# Patient Record
Sex: Female | Born: 1970
Health system: Southern US, Community
[De-identification: ages and names within clinical notes are randomized; demographics above are authoritative.]

## PROBLEM LIST (undated history)

## (undated) DIAGNOSIS — Z9889 Other specified postprocedural states: Secondary | ICD-10-CM

## (undated) DIAGNOSIS — R112 Nausea with vomiting, unspecified: Secondary | ICD-10-CM

## (undated) DIAGNOSIS — K5900 Constipation, unspecified: Secondary | ICD-10-CM

## (undated) DIAGNOSIS — K219 Gastro-esophageal reflux disease without esophagitis: Secondary | ICD-10-CM

## (undated) DIAGNOSIS — M62838 Other muscle spasm: Secondary | ICD-10-CM

## (undated) DIAGNOSIS — J189 Pneumonia, unspecified organism: Secondary | ICD-10-CM

## (undated) DIAGNOSIS — I10 Essential (primary) hypertension: Secondary | ICD-10-CM

## (undated) DIAGNOSIS — Z8709 Personal history of other diseases of the respiratory system: Secondary | ICD-10-CM

## (undated) DIAGNOSIS — R531 Weakness: Secondary | ICD-10-CM

## (undated) DIAGNOSIS — Z87442 Personal history of urinary calculi: Secondary | ICD-10-CM

## (undated) DIAGNOSIS — M2540 Effusion, unspecified joint: Secondary | ICD-10-CM

## (undated) HISTORY — PX: MYRINGOTOMY: SUR874

## (undated) HISTORY — PX: OTHER SURGICAL HISTORY: SHX169

## (undated) HISTORY — PX: CHOLECYSTECTOMY: SHX55

## (undated) HISTORY — DX: Essential (primary) hypertension: I10

---

## 1997-05-11 ENCOUNTER — Ambulatory Visit (HOSPITAL_COMMUNITY): Admission: RE | Admit: 1997-05-11 | Discharge: 1997-05-11 | Payer: Self-pay | Admitting: Obstetrics and Gynecology

## 1997-10-12 ENCOUNTER — Other Ambulatory Visit: Admission: RE | Admit: 1997-10-12 | Discharge: 1997-10-12 | Payer: Self-pay | Admitting: Gynecology

## 1997-10-13 ENCOUNTER — Inpatient Hospital Stay (HOSPITAL_COMMUNITY): Admission: AD | Admit: 1997-10-13 | Discharge: 1997-10-13 | Payer: Self-pay | Admitting: Gynecology

## 1997-10-17 ENCOUNTER — Ambulatory Visit (HOSPITAL_COMMUNITY): Admission: RE | Admit: 1997-10-17 | Discharge: 1997-10-17 | Payer: Self-pay | Admitting: Gynecology

## 1997-10-19 ENCOUNTER — Ambulatory Visit (HOSPITAL_COMMUNITY): Admission: RE | Admit: 1997-10-19 | Discharge: 1997-10-19 | Payer: Self-pay | Admitting: Gynecology

## 1997-10-26 ENCOUNTER — Ambulatory Visit (HOSPITAL_COMMUNITY): Admission: RE | Admit: 1997-10-26 | Discharge: 1997-10-26 | Payer: Self-pay | Admitting: Gynecology

## 1997-11-02 ENCOUNTER — Ambulatory Visit (HOSPITAL_COMMUNITY): Admission: RE | Admit: 1997-11-02 | Discharge: 1997-11-02 | Payer: Self-pay | Admitting: Gynecology

## 1998-05-31 ENCOUNTER — Other Ambulatory Visit: Admission: RE | Admit: 1998-05-31 | Discharge: 1998-05-31 | Payer: Self-pay | Admitting: Gynecology

## 1999-10-04 ENCOUNTER — Encounter: Payer: Self-pay | Admitting: Family Medicine

## 1999-10-04 ENCOUNTER — Ambulatory Visit (HOSPITAL_COMMUNITY): Admission: RE | Admit: 1999-10-04 | Discharge: 1999-10-04 | Payer: Self-pay | Admitting: Family Medicine

## 1999-11-13 ENCOUNTER — Other Ambulatory Visit: Admission: RE | Admit: 1999-11-13 | Discharge: 1999-11-13 | Payer: Self-pay | Admitting: Family Medicine

## 1999-12-14 ENCOUNTER — Other Ambulatory Visit: Admission: RE | Admit: 1999-12-14 | Discharge: 1999-12-14 | Payer: Self-pay | Admitting: Family Medicine

## 1999-12-14 ENCOUNTER — Other Ambulatory Visit: Admission: RE | Admit: 1999-12-14 | Discharge: 1999-12-14 | Payer: Self-pay | Admitting: Unknown Physician Specialty

## 1999-12-25 ENCOUNTER — Encounter: Payer: Self-pay | Admitting: Family Medicine

## 1999-12-25 ENCOUNTER — Ambulatory Visit (HOSPITAL_COMMUNITY): Admission: RE | Admit: 1999-12-25 | Discharge: 1999-12-25 | Payer: Self-pay | Admitting: *Deleted

## 2000-02-13 ENCOUNTER — Encounter: Payer: Self-pay | Admitting: Obstetrics and Gynecology

## 2000-02-13 ENCOUNTER — Ambulatory Visit (HOSPITAL_COMMUNITY): Admission: RE | Admit: 2000-02-13 | Discharge: 2000-02-13 | Payer: Self-pay | Admitting: Obstetrics and Gynecology

## 2000-05-19 ENCOUNTER — Inpatient Hospital Stay (HOSPITAL_COMMUNITY): Admission: AD | Admit: 2000-05-19 | Discharge: 2000-05-19 | Payer: Self-pay | Admitting: Family Medicine

## 2000-05-19 ENCOUNTER — Ambulatory Visit (HOSPITAL_COMMUNITY): Admission: RE | Admit: 2000-05-19 | Discharge: 2000-05-19 | Payer: Self-pay | Admitting: Obstetrics and Gynecology

## 2000-05-19 ENCOUNTER — Encounter: Payer: Self-pay | Admitting: Obstetrics and Gynecology

## 2000-05-22 ENCOUNTER — Inpatient Hospital Stay (HOSPITAL_COMMUNITY): Admission: AD | Admit: 2000-05-22 | Discharge: 2000-05-26 | Payer: Self-pay | Admitting: Obstetrics and Gynecology

## 2004-05-10 ENCOUNTER — Other Ambulatory Visit: Admission: RE | Admit: 2004-05-10 | Discharge: 2004-05-10 | Payer: Self-pay | Admitting: Gynecology

## 2010-12-14 ENCOUNTER — Emergency Department (HOSPITAL_COMMUNITY)
Admission: EM | Admit: 2010-12-14 | Discharge: 2010-12-14 | Disposition: A | Discharge status: Home / Self Care | Payer: PRIVATE HEALTH INSURANCE | Attending: Emergency Medicine | Admitting: Emergency Medicine

## 2010-12-14 DIAGNOSIS — J189 Pneumonia, unspecified organism: Secondary | ICD-10-CM | POA: Insufficient documentation

## 2010-12-14 DIAGNOSIS — H5789 Other specified disorders of eye and adnexa: Secondary | ICD-10-CM | POA: Insufficient documentation

## 2010-12-14 DIAGNOSIS — Z79899 Other long term (current) drug therapy: Secondary | ICD-10-CM | POA: Insufficient documentation

## 2010-12-14 DIAGNOSIS — T394X5A Adverse effect of antirheumatics, not elsewhere classified, initial encounter: Secondary | ICD-10-CM | POA: Insufficient documentation

## 2010-12-14 DIAGNOSIS — R51 Headache: Secondary | ICD-10-CM | POA: Insufficient documentation

## 2010-12-14 DIAGNOSIS — I10 Essential (primary) hypertension: Secondary | ICD-10-CM | POA: Insufficient documentation

## 2010-12-22 ENCOUNTER — Other Ambulatory Visit: Payer: Self-pay | Admitting: Family Medicine

## 2010-12-22 ENCOUNTER — Ambulatory Visit (HOSPITAL_COMMUNITY)
Admission: RE | Admit: 2010-12-22 | Discharge: 2010-12-22 | Disposition: A | Discharge status: Home / Self Care | Payer: PRIVATE HEALTH INSURANCE | Source: Ambulatory Visit | Attending: Family Medicine | Admitting: Family Medicine

## 2010-12-22 DIAGNOSIS — J189 Pneumonia, unspecified organism: Secondary | ICD-10-CM | POA: Insufficient documentation

## 2010-12-22 MED ORDER — IOHEXOL 300 MG/ML  SOLN: 125.0000 mL | Freq: Once | INTRAMUSCULAR | Status: AC | PRN

## 2012-08-03 ENCOUNTER — Telehealth: Payer: Self-pay

## 2012-08-03 NOTE — Telephone Encounter (Signed)
Patient calling to get Lisinopril refilled -has not been here in two years says Dr. Elbert Ewings gave her the Rx.  - lauenstein Please let her know if this can be refilled or if she needs to come in to be seen   Best:  [phone]

## 2012-08-03 NOTE — Telephone Encounter (Signed)
No we can not do this without office visit that is very poor patient care. I have called her to advise.

## 2013-07-12 ENCOUNTER — Ambulatory Visit (INDEPENDENT_AMBULATORY_CARE_PROVIDER_SITE_OTHER): Payer: BC Managed Care – PPO | Admitting: Internal Medicine

## 2013-07-12 ENCOUNTER — Ambulatory Visit: Payer: BC Managed Care – PPO

## 2013-07-12 VITALS — BP 128/72 | HR 88 | Temp 98.2°F | Resp 16 | Ht 66.0 in | Wt 326.0 lb

## 2013-07-12 DIAGNOSIS — I10 Essential (primary) hypertension: Secondary | ICD-10-CM

## 2013-07-12 DIAGNOSIS — M545 Low back pain, unspecified: Secondary | ICD-10-CM

## 2013-07-12 DIAGNOSIS — D649 Anemia, unspecified: Secondary | ICD-10-CM

## 2013-07-12 DIAGNOSIS — Z5181 Encounter for therapeutic drug level monitoring: Secondary | ICD-10-CM

## 2013-07-12 LAB — TSH: TSH: 1.354 u[IU]/mL (ref 0.350–4.500)

## 2013-07-12 LAB — POCT CBC
Granulocyte percent: 75.3 %G (ref 37–80)
HCT, POC: 40.9 % (ref 37.7–47.9)
Hemoglobin: 13.1 g/dL (ref 12.2–16.2)
Lymph, poc: 1.9 (ref 0.6–3.4)
MCH, POC: 28.7 pg (ref 27–31.2)
MCHC: 32 g/dL (ref 31.8–35.4)
MCV: 89.4 fL (ref 80–97)
MID (cbc): 0.4 (ref 0–0.9)
MPV: 10 fL (ref 0–99.8)
POC Granulocyte: 7 — AB (ref 2–6.9)
POC LYMPH PERCENT: 20.9 %L (ref 10–50)
POC MID %: 3.8 %M (ref 0–12)
Platelet Count, POC: 307 10*3/uL (ref 142–424)
RBC: 4.57 M/uL (ref 4.04–5.48)
RDW, POC: 14.9 %
WBC: 9.3 10*3/uL (ref 4.6–10.2)

## 2013-07-12 LAB — POCT URINE PREGNANCY: Preg Test, Ur: NEGATIVE

## 2013-07-12 LAB — COMPREHENSIVE METABOLIC PANEL
ALBUMIN: 4 g/dL (ref 3.5–5.2)
ALT: 18 U/L (ref 0–35)
AST: 15 U/L (ref 0–37)
Alkaline Phosphatase: 101 U/L (ref 39–117)
BUN: 13 mg/dL (ref 6–23)
CHLORIDE: 102 meq/L (ref 96–112)
CO2: 27 mEq/L (ref 19–32)
Calcium: 9.3 mg/dL (ref 8.4–10.5)
Creat: 0.76 mg/dL (ref 0.50–1.10)
Glucose, Bld: 85 mg/dL (ref 70–99)
Potassium: 3.7 mEq/L (ref 3.5–5.3)
SODIUM: 141 meq/L (ref 135–145)
TOTAL PROTEIN: 6.7 g/dL (ref 6.0–8.3)
Total Bilirubin: 0.4 mg/dL (ref 0.2–1.2)

## 2013-07-12 LAB — POCT URINALYSIS DIPSTICK
Bilirubin, UA: NEGATIVE
GLUCOSE UA: NEGATIVE
LEUKOCYTES UA: NEGATIVE
Nitrite, UA: NEGATIVE
PH UA: 7.5
Protein, UA: 30
Spec Grav, UA: 1.025
Urobilinogen, UA: 0.2

## 2013-07-12 LAB — POCT UA - MICROSCOPIC ONLY
Casts, Ur, LPF, POC: NEGATIVE
Crystals, Ur, HPF, POC: NEGATIVE
MUCUS UA: NEGATIVE
Yeast, UA: NEGATIVE

## 2013-07-12 LAB — POCT SEDIMENTATION RATE: POCT SED RATE: 40 mm/hr — AB (ref 0–22)

## 2013-07-12 MED ORDER — HYDROCODONE-ACETAMINOPHEN 5-325 MG PO TABS: 1.0000 | ORAL_TABLET | Freq: Four times a day (QID) | ORAL | Status: DC | PRN

## 2013-07-12 MED ORDER — METHOCARBAMOL 750 MG PO TABS: 750.0000 mg | ORAL_TABLET | Freq: Four times a day (QID) | ORAL | Status: DC

## 2013-07-12 NOTE — Progress Notes (Signed)
Subjective:    Patient ID: Tammy Briggs, female    DOB: [birth year], 43 y.o.   MRN: [id number]  HPI 43 yr old female is here today with low back pain for 4 weeks. She states she has not fallen or injured herself. She denies numbness, tingling but more painful to sit. She states she has tried a pressure point brace, exercises, ibuprofen with no relief. She states she has had kidney stones in the past. She states it is a deep pain and not painful to touch. She denies fever, hematuria, nausea, abdominal pain. No freq, urgency, hematuria, dysuria. Husband had vasectomy, LMP 4/25 and normal. Inspection of back, buttocks, sides resulted in no visible rashes, redness, pain. Has htn txed by Dr. Merla Richesoolittle, on lisinopril.  She is taking very big doses of ibuprofen for weeks, no hx for side affects   Review of Systems Has not seen gyn in years    Objective:   Physical Exam  Vitals reviewed. Constitutional: She is oriented to person, place, and time. She appears well-nourished. She appears distressed.  HENT:  Head: Normocephalic.  Eyes: EOM are normal. No scleral icterus.  Neck: Normal range of motion.  Cardiovascular: Normal rate and normal heart sounds.   Pulmonary/Chest: Effort normal.  Abdominal: Soft. There is no tenderness.  Musculoskeletal: She exhibits tenderness.       Lumbar back: She exhibits decreased range of motion, tenderness, pain and spasm. She exhibits no bony tenderness, no swelling, no edema, no deformity and normal pulse.       Back:  Straight leg left positive pain into buttock  Neurological: She is alert and oriented to person, place, and time. She has normal strength. No sensory deficit. She exhibits normal muscle tone. Coordination and gait normal.  Reflex Scores:      Patellar reflexes are 0 on the right side and 0 on the left side.      Achilles reflexes are 0 on the right side and 0 on the left side. Will not relax to test dtrs  Skin: Skin is intact. No rash  noted.  Psychiatric: She has a normal mood and affect. Her behavior is normal.   Very obese/ 128/78  [facility] reading (PRIMARY) by  Dr Perrin MalteseGuest normal spine Results for orders placed in visit on 07/12/13  POCT CBC      Result Value Ref Range   WBC 9.3  4.6 - 10.2 K/uL   Lymph, poc 1.9  0.6 - 3.4   POC LYMPH PERCENT 20.9  10 - 50 %L   MID (cbc) 0.4  0 - 0.9   POC MID % 3.8  0 - 12 %M   POC Granulocyte 7.0 (*) 2 - 6.9   Granulocyte percent 75.3  37 - 80 %G   RBC 4.57  4.04 - 5.48 M/uL   Hemoglobin 13.1  12.2 - 16.2 g/dL   HCT, POC 46.940.9  62.937.7 - 47.9 %   MCV 89.4  80 - 97 fL   MCH, POC 28.7  27 - 31.2 pg   MCHC 32.0  31.8 - 35.4 g/dL   RDW, POC 52.814.9     Platelet Count, POC 307  142 - 424 K/uL   MPV 10.0  0 - 99.8 fL  POCT UA - MICROSCOPIC ONLY      Result Value Ref Range   WBC, Ur, HPF, POC 1-4     RBC, urine, microscopic 0-2     Bacteria, U Microscopic trace  Mucus, UA neg     Epithelial cells, urine per micros 0-6     Crystals, Ur, HPF, POC neg     Casts, Ur, LPF, POC neg     Yeast, UA neg    POCT URINALYSIS DIPSTICK      Result Value Ref Range   Color, UA yellow     Clarity, UA clear     Glucose, UA neg     Bilirubin, UA neg     Ketones, UA trace     Spec Grav, UA 1.025     Blood, UA trace     pH, UA 7.5     Protein, UA 30     Urobilinogen, UA 0.2     Nitrite, UA neg     Leukocytes, UA Negative    POCT URINE PREGNANCY      Result Value Ref Range   Preg Test, Ur Negative           Assessment & Plan:  LBP one month/Possible bulging disc left Schedule full gyn exam HTN controlled/cmet done Nsaid excess use/DC NSAIDS for now. Tylenol/Vicodin

## 2013-07-12 NOTE — Patient Instructions (Signed)
Back Pain, Adult Low back pain is very common. About 1 in 5 people have back pain.The cause of low back pain is rarely dangerous. The pain often gets better over time.About half of people with a sudden onset of back pain feel better in just 2 weeks. About 8 in 10 people feel better by 6 weeks.  CAUSES Some common causes of back pain include:  Strain of the muscles or ligaments supporting the spine.  Wear and tear (degeneration) of the spinal discs.  Arthritis.  Direct injury to the back. DIAGNOSIS Most of the time, the direct cause of low back pain is not known.However, back pain can be treated effectively even when the exact cause of the pain is unknown.Answering your caregiver's questions about your overall health and symptoms is one of the most accurate ways to make sure the cause of your pain is not dangerous. If your caregiver needs more information, he or she may order lab work or imaging tests (X-rays or MRIs).However, even if imaging tests show changes in your back, this usually does not require surgery. HOME CARE INSTRUCTIONS For many people, back pain returns.Since low back pain is rarely dangerous, it is often a condition that people can learn to manageon their own.   Remain active. It is stressful on the back to sit or stand in one place. Do not sit, drive, or stand in one place for more than 30 minutes at a time. Take short walks on level surfaces as soon as pain allows.Try to increase the length of time you walk each day.  Do not stay in bed.Resting more than 1 or 2 days can delay your recovery.  Do not avoid exercise or work.Your body is made to move.It is not dangerous to be active, even though your back may hurt.Your back will likely heal faster if you return to being active before your pain is gone.  Pay attention to your body when you bend and lift. Many people have less discomfortwhen lifting if they bend their knees, keep the load close to their bodies,and  avoid twisting. Often, the most comfortable positions are those that put less stress on your recovering back.  Find a comfortable position to sleep. Use a firm mattress and lie on your side with your knees slightly bent. If you lie on your back, put a pillow under your knees.  Only take over-the-counter or prescription medicines as directed by your caregiver. Over-the-counter medicines to reduce pain and inflammation are often the most helpful.Your caregiver may prescribe muscle relaxant drugs.These medicines help dull your pain so you can more quickly return to your normal activities and healthy exercise.  Put ice on the injured area.  Put ice in a plastic bag.  Place a towel between your skin and the bag.  Leave the ice on for 15-20 minutes, 03-04 times a day for the first 2 to 3 days. After that, ice and heat may be alternated to reduce pain and spasms.  Ask your caregiver about trying back exercises and gentle massage. This may be of some benefit.  Avoid feeling anxious or stressed.Stress increases muscle tension and can worsen back pain.It is important to recognize when you are anxious or stressed and learn ways to manage it.Exercise is a great option. SEEK MEDICAL CARE IF:  You have pain that is not relieved with rest or medicine.  You have pain that does not improve in 1 week.  You have new symptoms.  You are generally not feeling well. SEEK   IMMEDIATE MEDICAL CARE IF:   You have pain that radiates from your back into your legs.  You develop new bowel or bladder control problems.  You have unusual weakness or numbness in your arms or legs.  You develop nausea or vomiting.  You develop abdominal pain.  You feel faint. Document Released: 02/11/2005 Document Revised: 08/13/2011 Document Reviewed: 07/02/2010 ExitCare Patient Information 2014 [employer].  

## 2013-07-13 ENCOUNTER — Telehealth: Payer: Self-pay

## 2013-07-13 ENCOUNTER — Encounter: Payer: Self-pay | Admitting: *Deleted

## 2013-07-13 DIAGNOSIS — M62838 Other muscle spasm: Secondary | ICD-10-CM

## 2013-07-13 MED ORDER — METAXALONE 800 MG PO TABS: 800.0000 mg | ORAL_TABLET | Freq: Three times a day (TID) | ORAL | Status: DC

## 2013-07-13 NOTE — Telephone Encounter (Signed)
Pt.notified

## 2013-07-13 NOTE — Telephone Encounter (Signed)
Medication sent in. Have patient stop the Robaxin.

## 2013-07-13 NOTE — Telephone Encounter (Signed)
Robaxin has been giving pt severe headaches. Can we prescribe something different? [facility]

## 2013-07-13 NOTE — Telephone Encounter (Signed)
PT WAS SEEN YESTERDAY AND WOULD LIKE TO SPEAK WITH SOMEONE REGARDING HER MEDICATION. PLEASE CALL [phone]

## 2013-08-21 ENCOUNTER — Other Ambulatory Visit: Payer: Self-pay | Admitting: Internal Medicine

## 2013-08-26 ENCOUNTER — Ambulatory Visit (INDEPENDENT_AMBULATORY_CARE_PROVIDER_SITE_OTHER): Payer: BC Managed Care – PPO | Admitting: Emergency Medicine

## 2013-08-26 VITALS — BP 192/112 | HR 85 | Temp 97.7°F | Resp 18 | Ht 66.0 in | Wt 326.0 lb

## 2013-08-26 DIAGNOSIS — M5432 Sciatica, left side: Secondary | ICD-10-CM

## 2013-08-26 DIAGNOSIS — I10 Essential (primary) hypertension: Secondary | ICD-10-CM

## 2013-08-26 DIAGNOSIS — M5430 Sciatica, unspecified side: Secondary | ICD-10-CM

## 2013-08-26 MED ORDER — NAPROXEN SODIUM 550 MG PO TABS: 550.0000 mg | ORAL_TABLET | Freq: Two times a day (BID) | ORAL | Status: DC

## 2013-08-26 MED ORDER — HYDROCODONE-ACETAMINOPHEN 5-325 MG PO TABS: 1.0000 | ORAL_TABLET | ORAL | Status: DC | PRN

## 2013-08-26 MED ORDER — LISINOPRIL-HYDROCHLOROTHIAZIDE 10-12.5 MG PO TABS: 1.0000 | ORAL_TABLET | Freq: Every day | ORAL | Status: DC

## 2013-08-26 MED ORDER — CYCLOBENZAPRINE HCL 10 MG PO TABS: 10.0000 mg | ORAL_TABLET | Freq: Three times a day (TID) | ORAL | Status: DC | PRN

## 2013-08-26 NOTE — Progress Notes (Signed)
[facility] [address], [place] [address] [phone]  Date:  08/26/2013   Name:  Tammy PeltChristy S Briggs   DOB:  October 20, 1970   MRN:  [id number]  PCP:  Tonye PearsonOLITTLE, ROBERT P, MD    Chief Complaint: Back Pain   History of Present Illness:  Tammy Briggs is a 43 y.o. very pleasant female patient who presents with the following:  Seen in May for pain radiating down her left leg associated with numbness and tingling at times.  No weakness.  No history of injury or overuse.  Lumbar spine film done in May was negative.  Had relief of pain but now has recurred.  Says worse in am on arising and diminishes when she gets on her hands and knees.  No improvement with over the counter medications or other home remedies. Denies other complaint or health concern today.   Has not been compliant with blood pressure medication  There are no active problems to display for this patient.   Past Medical History  Diagnosis Date  . Hypertension     Past Surgical History  Procedure Laterality Date  . Cholecystectomy      History  Substance Use Topics  . Smoking status: Former Games developermoker  . Smokeless tobacco: Not on file  . Alcohol Use: No    Family History  Problem Relation Age of Onset  . Hypertension Mother   . Hypertension Father   . Diabetes Father     No Known Allergies  Medication list has been reviewed and updated.  Current Outpatient Prescriptions on File Prior to Visit  Medication Sig Dispense Refill  . lisinopril-hydrochlorothiazide (PRINZIDE,ZESTORETIC) 10-12.5 MG per tablet Take 1 tablet by mouth daily.      . metaxalone (SKELAXIN) 800 MG tablet Take 1 tablet (800 mg total) by mouth 3 (three) times daily.  30 tablet  0   No current facility-administered medications on file prior to visit.    Review of Systems:  As per HPI, otherwise negative.    Physical Examination: Filed Vitals:   08/26/13 1644  BP: 192/112  Pulse: 85  Temp: 97.7 F (36.5  C)  Resp: 18   Filed Vitals:   08/26/13 1644  Height: 5\' 6"  (1.676 m)  Weight: 326 lb (147.873 kg)   Body mass index is 52.64 kg/(m^2). Ideal Body Weight: Weight in (lb) to have BMI = 25: 154.6   GEN: morbidly obese, NAD, Non-toxic, Alert & Oriented x 3 HEENT: Atraumatic, Normocephalic.  Ears and Nose: No external deformity. EXTR: No clubbing/cyanosis/edema NEURO: Normal gait.  Normal motor and sensory lowers.  DTR's intact PSYCH: Normally interactive. Conversant. Not depressed or anxious appearing.  Calm demeanor.    Assessment and Plan: Sciatic neuritis Morbid obesity. Hypertension MRI Offered referral to bariatric Anaprox Flexeril vicodin   Signed,  Phillips OdorJeffery Avilla, MD

## 2013-08-27 ENCOUNTER — Telehealth: Payer: Self-pay

## 2013-08-27 NOTE — Telephone Encounter (Signed)
To expedite the review of your request, contact AIM at [phone] to find out what additional information is needed or if the ordering provider would like to discuss this case with a physician reviewer.         Order Request Summary     Request Status:  In Progress                     Health Plan: [employer]               Case Due to Close On/Before:  09/01/2013         Scheduled Date of Service:  08/27/2013                    Member Information: Sida,Florenda Member #:[id number] [address] [id number] Date of Birth:1970-04-23 Phone:   Ordering Provider:  Shane CrutchNDERSON,JEFFREY S [address] [id number] Phone:[phone] Fax:[phone] [id number]   Imaging Facility:      [facility] [address]  [place] Phone:[phone] Fax: [id number]                      Exam Information:               The information below was obtained from the Ordering Provider and has not been independently verified by AIM. AIM assumes no responsibility for the accuracy of this information or for its consistency with the patient's medical record.         SUMMARY OF EXAMS (1)       Exam   Exam Request Status         Lumbar Spine - MRI   Without Contrast          Review ExamWithdraw Exam                                                                               = Multiple Decisions Rendered                     -------------------------------------------------------------------------------- The issuance of an Order ID is not a guarantee of benefits; payment is subject to the member's eligibility and plan provisions in effect at the time of  service. --------------------------------------------------------------------------------                                                                             [employer] and [employer] [place] is an Armed forces technical officerindependent licensee of the [employer] and [employer].                   Have a comment or suggestion?     Copyright  2012, [facility], All Rights Reserved.                              --------------------------------------------------------------------------------

## 2013-08-27 NOTE — Telephone Encounter (Signed)
Not open today.  Try again on Monday 08/30/2013.

## 2013-08-31 ENCOUNTER — Telehealth: Payer: Self-pay

## 2013-08-31 NOTE — Telephone Encounter (Signed)
Tried to complete peer to peer, sat on hold for 10 minutes without reaching anyone.  Will try again later.

## 2013-08-31 NOTE — Telephone Encounter (Signed)
Tammy Briggs - Pt was seen on 08-26-13 and thinks that one of the medicines prescribed is upsetting her stomach. [phone]

## 2013-08-31 NOTE — Telephone Encounter (Signed)
Pt says that the Anaprox is really upsetting her stomach, even when taking it with food. She is still having the sciatica pain. Wants to know if there is a different anti inflammatory that we can try?

## 2013-08-31 NOTE — Telephone Encounter (Signed)
Approval #: [id number] 30 days to complete

## 2013-08-31 NOTE — Telephone Encounter (Signed)
Anaprox, Flexeril, and Vicodin were given on 08/26/13. Left message on machine to call back to find out which one is bothering her.

## 2013-09-01 MED ORDER — MELOXICAM 7.5 MG PO TABS: 7.5000 mg | ORAL_TABLET | Freq: Every day | ORAL | Status: DC

## 2013-09-01 NOTE — Telephone Encounter (Signed)
Advised pt

## 2013-09-01 NOTE — Telephone Encounter (Signed)
Sent to [facility] with the authorization number

## 2013-09-01 NOTE — Telephone Encounter (Signed)
I sent in Mobic for the patient.

## 2013-09-02 ENCOUNTER — Telehealth: Payer: Self-pay

## 2013-09-02 NOTE — Telephone Encounter (Signed)
Pt called in and wanted to know if she could possibly have something stronger than Hydrocodone, She is having to take 3-4 of them daily. She is having her MRI tomorrow then going out of town. She would like a refill or something stronger by her leaving this weekend. She can be reached at [phone]. Thank you

## 2013-09-03 ENCOUNTER — Other Ambulatory Visit: Payer: Self-pay | Admitting: Emergency Medicine

## 2013-09-03 ENCOUNTER — Ambulatory Visit
Admission: RE | Admit: 2013-09-03 | Discharge: 2013-09-03 | Disposition: A | Discharge status: Home / Self Care | Payer: BC Managed Care – PPO | Source: Ambulatory Visit | Attending: Emergency Medicine | Admitting: Emergency Medicine

## 2013-09-03 DIAGNOSIS — M5432 Sciatica, left side: Secondary | ICD-10-CM

## 2013-09-03 MED ORDER — HYDROCODONE-ACETAMINOPHEN 5-325 MG PO TABS: 1.0000 | ORAL_TABLET | ORAL | Status: DC | PRN

## 2013-09-03 NOTE — Telephone Encounter (Signed)
Pt typically takes one tab at 8am, sometimes another at 9 if the 8am one didn't help, and then she takes another tab at 4pm, and another qhs prn. Can we RF? If so please print, she's ok with coming to get it.

## 2013-09-03 NOTE — Telephone Encounter (Signed)
Needs referral to neurosurgery

## 2013-09-03 NOTE — Telephone Encounter (Signed)
Can you please let me know what to tell the pt as to why we are referring her to neuro. Thanks

## 2013-09-03 NOTE — Telephone Encounter (Signed)
Pt notified.  Dr. Dareen PianoAnderson, can you review her MRI please.

## 2013-09-03 NOTE — Telephone Encounter (Signed)
She has a herniated disc

## 2013-09-03 NOTE — Telephone Encounter (Signed)
vicodin is a q4h drug.  If she wants a refill, she must come by and pick it up

## 2013-09-03 NOTE — Telephone Encounter (Signed)
Pt.notified

## 2013-09-15 ENCOUNTER — Other Ambulatory Visit: Payer: PRIVATE HEALTH INSURANCE

## 2013-09-21 ENCOUNTER — Encounter (HOSPITAL_COMMUNITY): Payer: Self-pay | Admitting: Pharmacy Technician

## 2013-09-24 ENCOUNTER — Other Ambulatory Visit: Payer: Self-pay | Admitting: Neurosurgery

## 2013-09-28 ENCOUNTER — Encounter (HOSPITAL_COMMUNITY)
Admission: RE | Admit: 2013-09-28 | Discharge: 2013-09-28 | Disposition: A | Discharge status: Home / Self Care | Payer: BC Managed Care – PPO | Source: Ambulatory Visit | Attending: Neurosurgery | Admitting: Neurosurgery

## 2013-09-28 ENCOUNTER — Ambulatory Visit (HOSPITAL_COMMUNITY)
Admission: RE | Admit: 2013-09-28 | Discharge: 2013-09-28 | Disposition: A | Discharge status: Home / Self Care | Payer: BC Managed Care – PPO | Source: Ambulatory Visit | Attending: Anesthesiology | Admitting: Anesthesiology

## 2013-09-28 ENCOUNTER — Encounter (HOSPITAL_COMMUNITY): Payer: Self-pay

## 2013-09-28 DIAGNOSIS — Z01818 Encounter for other preprocedural examination: Secondary | ICD-10-CM | POA: Insufficient documentation

## 2013-09-28 HISTORY — DX: Nausea with vomiting, unspecified: R11.2

## 2013-09-28 HISTORY — DX: Effusion, unspecified joint: M25.40

## 2013-09-28 HISTORY — DX: Personal history of urinary calculi: Z87.442

## 2013-09-28 HISTORY — DX: Gastro-esophageal reflux disease without esophagitis: K21.9

## 2013-09-28 HISTORY — DX: Pneumonia, unspecified organism: J18.9

## 2013-09-28 HISTORY — DX: Weakness: R53.1

## 2013-09-28 HISTORY — DX: Personal history of other diseases of the respiratory system: Z87.09

## 2013-09-28 HISTORY — DX: Other specified postprocedural states: Z98.890

## 2013-09-28 HISTORY — DX: Other muscle spasm: M62.838

## 2013-09-28 HISTORY — DX: Constipation, unspecified: K59.00

## 2013-09-28 LAB — SURGICAL PCR SCREEN
MRSA, PCR: NEGATIVE
Staphylococcus aureus: POSITIVE — AB

## 2013-09-28 LAB — BASIC METABOLIC PANEL
ANION GAP: 14 (ref 5–15)
BUN: 15 mg/dL (ref 6–23)
CHLORIDE: 102 meq/L (ref 96–112)
CO2: 27 meq/L (ref 19–32)
CREATININE: 0.68 mg/dL (ref 0.50–1.10)
Calcium: 9.3 mg/dL (ref 8.4–10.5)
GFR calc non Af Amer: >90 mL/min (ref >90)
Glucose, Bld: 83 mg/dL (ref 70–99)
Potassium: 3.8 mEq/L (ref 3.7–5.3)
Sodium: 143 mEq/L (ref 137–147)

## 2013-09-28 LAB — CBC
HCT: 39.5 % (ref 36.0–46.0)
HEMOGLOBIN: 12.9 g/dL (ref 12.0–15.0)
MCH: 28.4 pg (ref 26.0–34.0)
MCHC: 32.7 g/dL (ref 30.0–36.0)
MCV: 86.8 fL (ref 78.0–100.0)
Platelets: 268 10*3/uL (ref 150–400)
RBC: 4.55 MIL/uL (ref 3.87–5.11)
RDW: 14 % (ref 11.5–15.5)
WBC: 6.1 10*3/uL (ref 4.0–10.5)

## 2013-09-28 LAB — HCG, SERUM, QUALITATIVE: PREG SERUM: NEGATIVE

## 2013-09-28 NOTE — Pre-Procedure Instructions (Signed)
Pete PeltChristy S Garinger  09/28/2013   Your procedure is scheduled on:  Tues, Aug 11 @ 10:00 AM  [facility]  at 8:00 AM.  Call this number if you have problems the morning of surgery: [phone]   Remember:   Do not eat food or drink liquids after midnight.   Take these medicines the morning of surgery with A SIP OF WATER: Pain Pill(if needed)              No Goody's,BC's,Aleve,Aspirin,Ibuprofen,Fish Oil,or any Herbal Medications   Do not wear jewelry, make-up or nail polish.  Do not wear lotions, powders, or perfumes. You may wear deodorant.  Do not shave 48 hours prior to surgery.   Do not bring valuables to the hospital.  [facility] is not responsible                  for any belongings or valuables.               Contacts, dentures or bridgework may not be worn into surgery.  Leave suitcase in the car. After surgery it may be brought to your room.  For patients admitted to the hospital, discharge time is determined by your                treatment team.               Patients discharged the day of surgery will not be allowed to drive  home.    Special Instructions:  [place] - Preparing for Surgery  Before surgery, you can play an important role.  Because skin is not sterile, your skin needs to be as free of germs as possible.  You can reduce the number of germs on you skin by washing with CHG (chlorahexidine gluconate) soap before surgery.  CHG is an antiseptic cleaner which kills germs and bonds with the skin to continue killing germs even after washing.  Please DO NOT use if you have an allergy to CHG or antibacterial soaps.  If your skin becomes reddened/irritated stop using the CHG and inform your nurse when you arrive at Short Stay.  Do not shave (including legs and underarms) for at least 48 hours prior to the first CHG shower.  You may shave your face.  Please follow these instructions carefully:   1.  Shower with CHG Soap the night before surgery  and the                                morning of Surgery.  2.  If you choose to wash your hair, wash your hair first as usual with your       normal shampoo.  3.  After you shampoo, rinse your hair and body thoroughly to remove the                      Shampoo.  4.  Use CHG as you would any other liquid soap.  You can apply chg directly       to the skin and wash gently with scrungie or a clean washcloth.  5.  Apply the CHG Soap to your body ONLY FROM THE NECK DOWN.        Do not use on open wounds or open sores.  Avoid contact with your eyes,       ears, mouth and genitals (private parts).  Wash genitals (private parts)       with your normal soap.  6.  Wash thoroughly, paying special attention to the area where your surgery        will be performed.  7.  Thoroughly rinse your body with warm water from the neck down.  8.  DO NOT shower/wash with your normal soap after using and rinsing off       the CHG Soap.  9.  Pat yourself dry with a clean towel.            10.  Wear clean pajamas.            11.  Place clean sheets on your bed the night of your first shower and do not        sleep with pets.  Day of Surgery  Do not apply any lotions/deoderants the morning of surgery.  Please wear clean clothes to the hospital/surgery center.     Please read over the following fact sheets that you were given: Pain Booklet, Coughing and Deep Breathing, MRSA Information and Surgical Site Infection Prevention

## 2013-09-28 NOTE — Progress Notes (Signed)
Pt doesn't have a cardiologist  Denies ever having an echo/stress test/heart cath  Denies EKG or CXR in past yr  Medical Md is Dr.Doolittle

## 2013-09-29 NOTE — Progress Notes (Signed)
Anesthesia Chart Review:  Patient is a 43 year old female posted for one level lumbar laminectomy/decompression microdiscectomy on 10/05/13 by Dr. Gerlene FeeKritzer.  History includes former smoker, post-operative N/V, GERD, HTN, nephrolithiasis, cholecystectomy, adenoidectomy, PNA one year ago. BMI is consistent with morbid obesity.  PCP is Dr. Merla Richesoolittle.  EKG on 09/28/13 showed: NSR, cannot rule out anterior infarct (age undetermined). Currently there are no comparison EKGs available.  No CV symptoms documented at her PAT visit.   Preoperative CXR and labs noted.   She will be further evaluated by her assigned anesthesiologist on the day of surgery.  If no acute changes or new CV symptoms then I would anticipate that she could proceed as planned.  Velna Ochsllison Masen Luallen, PA-C [facility]/Anesthesiology Phone [phone] 09/29/2013 10:33 AM

## 2013-10-03 ENCOUNTER — Emergency Department (HOSPITAL_COMMUNITY)
Admission: EM | Admit: 2013-10-03 | Discharge: 2013-10-03 | Disposition: A | Discharge status: Home / Self Care | Payer: BC Managed Care – PPO | Attending: Emergency Medicine | Admitting: Emergency Medicine

## 2013-10-03 ENCOUNTER — Encounter (HOSPITAL_COMMUNITY): Payer: Self-pay | Admitting: Emergency Medicine

## 2013-10-03 DIAGNOSIS — M5430 Sciatica, unspecified side: Secondary | ICD-10-CM | POA: Diagnosis not present

## 2013-10-03 DIAGNOSIS — Z87442 Personal history of urinary calculi: Secondary | ICD-10-CM | POA: Insufficient documentation

## 2013-10-03 DIAGNOSIS — IMO0002 Reserved for concepts with insufficient information to code with codable children: Secondary | ICD-10-CM | POA: Insufficient documentation

## 2013-10-03 DIAGNOSIS — M5442 Lumbago with sciatica, left side: Secondary | ICD-10-CM

## 2013-10-03 DIAGNOSIS — X500XXA Overexertion from strenuous movement or load, initial encounter: Secondary | ICD-10-CM | POA: Diagnosis not present

## 2013-10-03 DIAGNOSIS — I10 Essential (primary) hypertension: Secondary | ICD-10-CM | POA: Insufficient documentation

## 2013-10-03 DIAGNOSIS — Z8701 Personal history of pneumonia (recurrent): Secondary | ICD-10-CM | POA: Insufficient documentation

## 2013-10-03 DIAGNOSIS — Y9289 Other specified places as the place of occurrence of the external cause: Secondary | ICD-10-CM | POA: Diagnosis not present

## 2013-10-03 DIAGNOSIS — Z79899 Other long term (current) drug therapy: Secondary | ICD-10-CM | POA: Insufficient documentation

## 2013-10-03 DIAGNOSIS — Z87891 Personal history of nicotine dependence: Secondary | ICD-10-CM | POA: Insufficient documentation

## 2013-10-03 DIAGNOSIS — K219 Gastro-esophageal reflux disease without esophagitis: Secondary | ICD-10-CM | POA: Insufficient documentation

## 2013-10-03 DIAGNOSIS — Y9389 Activity, other specified: Secondary | ICD-10-CM | POA: Diagnosis not present

## 2013-10-03 MED ORDER — HYDROMORPHONE HCL PF 1 MG/ML IJ SOLN
2.0000 mg | Freq: Once | INTRAMUSCULAR | Status: AC
  Administered 2013-10-03: 2 mg via INTRAVENOUS
  Filled 2013-10-03: qty 2

## 2013-10-03 MED ORDER — OXYCODONE HCL 5 MG PO TABS: 5.0000 mg | ORAL_TABLET | ORAL | Status: DC | PRN

## 2013-10-03 MED ORDER — DIAZEPAM 5 MG PO TABS
5.0000 mg | ORAL_TABLET | Freq: Once | ORAL | Status: AC
  Administered 2013-10-03: 5 mg via ORAL
  Filled 2013-10-03: qty 1

## 2013-10-03 MED ORDER — KETOROLAC TROMETHAMINE 30 MG/ML IJ SOLN
30.0000 mg | Freq: Once | INTRAMUSCULAR | Status: AC
  Administered 2013-10-03: 30 mg via INTRAVENOUS
  Filled 2013-10-03: qty 1

## 2013-10-03 MED ORDER — HYDROMORPHONE HCL PF 1 MG/ML IJ SOLN
1.0000 mg | INTRAMUSCULAR | Status: DC | PRN
  Administered 2013-10-03: 1 mg via INTRAVENOUS
  Filled 2013-10-03: qty 1

## 2013-10-03 NOTE — ED Notes (Signed)
Able to stand up and move to bedside commode.

## 2013-10-03 NOTE — ED Notes (Signed)
Known ruptured disc at L5; scheduled for surgery with Dr. Hali Marryritzer on Tuesday for this. This morning tried to get out of bed and heard a pop and had excruciating pain. Took Flexeril 10mg  and Hydrocodone 5-325 twice, but still could not get out of bed due to pain. Unable to stand since the pop due to excruciating pain.

## 2013-10-03 NOTE — ED Provider Notes (Signed)
CSN: [id number]     Arrival date & time 10/03/13  1323 History   First MD Initiated Contact with Patient 10/03/13 1356     Chief Complaint  Patient presents with  . Back Pain     HPI Patient presents to emergency room with acute onset back pain.  She is scheduled to have lumbar disc surgery in the coming days and she states when she tried to get out of bed she had a severe pain in her back and felt a pop.  She denies new weakness of her lower extremities.  She reports her pain is moderate to severe in severity and worse with movement of her low back.  No nausea or vomiting.  Denies fevers or chills.  No bowel or bladder issues.   Past Medical History  Diagnosis Date  . Hypertension     takes Lisinopril-HCTZ daily  . GERD (gastroesophageal reflux disease)     takes Nexium daily  . Muscle spasm     takes Flexeril daily as needed  . PONV (postoperative nausea and vomiting)   . Pneumonia hx of-5727yr ago  . History of bronchitis 3-1390yrs ago  . Weakness     numbness and tingling left leg  . Joint swelling     right   . Constipation   . History of kidney stones    Past Surgical History  Procedure Laterality Date  . Myringotomy      x 4  . Adeoinectomy    . Hpv removed  age 43  . Cholecystectomy  8143yrs ago   Family History  Problem Relation Age of Onset  . Hypertension Mother   . Hypertension Father   . Diabetes Father    History  Substance Use Topics  . Smoking status: Former Games developermoker  . Smokeless tobacco: Not on file     Comment: quit smoking 03/22/13  . Alcohol Use: No   OB History   Grav Para Term Preterm Abortions TAB SAB Ect Mult Living                 Review of Systems  All other systems reviewed and are negative.     Allergies  Review of patient's allergies indicates no known allergies.  Home Medications   Prior to Admission medications   Medication Sig Start Date End Date Taking? Authorizing Provider  cyclobenzaprine (FLEXERIL) 10 MG tablet Take 10 mg by  mouth 3 (three) times daily as needed for muscle spasms.   Yes Historical Provider, MD  esomeprazole (NEXIUM) 20 MG capsule Take 20 mg by mouth daily at 12 noon.   Yes Historical Provider, MD  HYDROcodone-acetaminophen (NORCO/VICODIN) 5-325 MG per tablet Take 1-2 tablets by mouth every 6 (six) hours as needed for moderate pain.   Yes Historical Provider, MD  lisinopril-hydrochlorothiazide (PRINZIDE,ZESTORETIC) 10-12.5 MG per tablet Take 1 tablet by mouth daily.   Yes Historical Provider, MD  mupirocin ointment (BACTROBAN) 2 % Place 1 application into the nose 2 (two) times daily.   Yes Historical Provider, MD  Oxycodone HCl 10 MG TABS Take 10 mg by mouth every 4 (four) hours as needed (for pain).   Yes Historical Provider, MD  vitamin C (ASCORBIC ACID) 500 MG tablet Take 1,000 mg by mouth daily.   Yes Historical Provider, MD   BP 131/85  Pulse 81  Temp(Src) 98.7 F (37.1 C) (Oral)  Resp 11  Ht 5\' 6"  (1.676 m)  Wt 326 lb (147.873 kg)  BMI 52.64 kg/m2  SpO2 100%  LMP 09/25/2013 Physical Exam  Nursing note and vitals reviewed. Constitutional: She is oriented to person, place, and time. She appears well-developed and well-nourished. No distress.  Morbidly obese  HENT:  Head: Normocephalic and atraumatic.  Eyes: EOM are normal.  Neck: Normal range of motion.  Cardiovascular: Normal rate, regular rhythm and normal heart sounds.   Pulmonary/Chest: Effort normal and breath sounds normal.  Abdominal: Soft. She exhibits no distension. There is no tenderness.  Musculoskeletal: Normal range of motion.  No thoracic or lumbar spine tenderness.  No paralumbar tenderness or spasm noted.  5 out of 5 strength in bilateral lower extremity major muscle groups.  No sensory deficits  Neurological: She is alert and oriented to person, place, and time.  Skin: Skin is warm and dry.  Psychiatric: She has a normal mood and affect. Judgment normal.    ED Course  Procedures (including critical care  time) Labs Review Labs Reviewed - No data to display  Imaging Review No results found.   EKG Interpretation None      MDM   Final diagnoses:  Low back pain with left-sided sciatica, unspecified back pain laterality    4:27 PM Improvement of patient's pain.  Patient was able to get up is at the bedside.  She'll be sent home with a prescription for 5 mg immediate release oxycodone that she can take in addition to her 10 mg oxycodone aren't prescribed.  This will give her greater flexibility to manage her pain at home until she undergoes surgery and 48 hours.  I've asked that she call her neurosurgical office tomorrow and let him know about her new worsening pain.    Lyanne Co, MD 10/03/13 [phone]

## 2013-10-04 MED ORDER — CEFAZOLIN SODIUM 10 G IJ SOLR
3.0000 g | INTRAMUSCULAR | Status: AC
  Administered 2013-10-05: 3 g via INTRAVENOUS
  Filled 2013-10-04: qty 3000

## 2013-10-04 MED ORDER — DEXAMETHASONE SODIUM PHOSPHATE 10 MG/ML IJ SOLN
10.0000 mg | INTRAMUSCULAR | Status: AC
  Administered 2013-10-05: 10 mg via INTRAVENOUS
  Filled 2013-10-04: qty 1

## 2013-10-05 ENCOUNTER — Ambulatory Visit (HOSPITAL_COMMUNITY): Payer: BC Managed Care – PPO

## 2013-10-05 ENCOUNTER — Encounter (HOSPITAL_COMMUNITY): Payer: BC Managed Care – PPO | Admitting: Vascular Surgery

## 2013-10-05 ENCOUNTER — Encounter (HOSPITAL_COMMUNITY)
Admission: RE | Disposition: A | Discharge status: Home / Self Care | Payer: Self-pay | Source: Ambulatory Visit | Attending: Neurosurgery

## 2013-10-05 ENCOUNTER — Ambulatory Visit (HOSPITAL_COMMUNITY): Payer: BC Managed Care – PPO | Admitting: Certified Registered Nurse Anesthetist

## 2013-10-05 ENCOUNTER — Ambulatory Visit (HOSPITAL_COMMUNITY)
Admission: RE | Admit: 2013-10-05 | Discharge: 2013-10-05 | Disposition: A | Discharge status: Home / Self Care | Payer: BC Managed Care – PPO | Source: Ambulatory Visit | Attending: Neurosurgery | Admitting: Neurosurgery

## 2013-10-05 ENCOUNTER — Encounter (HOSPITAL_COMMUNITY): Payer: Self-pay | Admitting: Certified Registered Nurse Anesthetist

## 2013-10-05 DIAGNOSIS — K219 Gastro-esophageal reflux disease without esophagitis: Secondary | ICD-10-CM | POA: Insufficient documentation

## 2013-10-05 DIAGNOSIS — Z87891 Personal history of nicotine dependence: Secondary | ICD-10-CM | POA: Insufficient documentation

## 2013-10-05 DIAGNOSIS — K5900 Constipation, unspecified: Secondary | ICD-10-CM | POA: Insufficient documentation

## 2013-10-05 DIAGNOSIS — M5126 Other intervertebral disc displacement, lumbar region: Secondary | ICD-10-CM | POA: Diagnosis not present

## 2013-10-05 DIAGNOSIS — Z79899 Other long term (current) drug therapy: Secondary | ICD-10-CM | POA: Insufficient documentation

## 2013-10-05 DIAGNOSIS — I10 Essential (primary) hypertension: Secondary | ICD-10-CM | POA: Diagnosis not present

## 2013-10-05 DIAGNOSIS — M62838 Other muscle spasm: Secondary | ICD-10-CM | POA: Diagnosis not present

## 2013-10-05 HISTORY — PX: LUMBAR LAMINECTOMY/DECOMPRESSION MICRODISCECTOMY: SHX5026

## 2013-10-05 SURGERY — LUMBAR LAMINECTOMY/DECOMPRESSION MICRODISCECTOMY 1 LEVEL
Anesthesia: General | Site: Back | Laterality: Left

## 2013-10-05 MED ORDER — LISINOPRIL 10 MG PO TABS
10.0000 mg | ORAL_TABLET | Freq: Every day | ORAL | Status: DC
  Administered 2013-10-05: 10 mg via ORAL
  Filled 2013-10-05: qty 1

## 2013-10-05 MED ORDER — VECURONIUM BROMIDE 10 MG IV SOLR
INTRAVENOUS | Status: AC
  Filled 2013-10-05: qty 10

## 2013-10-05 MED ORDER — OXYCODONE-ACETAMINOPHEN 5-325 MG PO TABS
1.0000 | ORAL_TABLET | ORAL | Status: DC | PRN
  Administered 2013-10-05: 1 via ORAL
  Filled 2013-10-05: qty 1

## 2013-10-05 MED ORDER — HEMOSTATIC AGENTS (NO CHARGE) OPTIME
TOPICAL | Status: DC | PRN
  Administered 2013-10-05: 1 via TOPICAL

## 2013-10-05 MED ORDER — PROPOFOL 10 MG/ML IV BOLUS
INTRAVENOUS | Status: DC | PRN
  Administered 2013-10-05: 200 mg via INTRAVENOUS

## 2013-10-05 MED ORDER — SODIUM CHLORIDE 0.9 % IJ SOLN: 3.0000 mL | INTRAMUSCULAR | Status: DC | PRN

## 2013-10-05 MED ORDER — ACETAMINOPHEN 325 MG PO TABS
650.0000 mg | ORAL_TABLET | ORAL | Status: DC | PRN
Start: 2013-10-05 — End: 2013-10-05

## 2013-10-05 MED ORDER — MIDAZOLAM HCL 5 MG/5ML IJ SOLN
INTRAMUSCULAR | Status: DC | PRN
  Administered 2013-10-05: 2 mg via INTRAVENOUS

## 2013-10-05 MED ORDER — SODIUM CHLORIDE 0.9 % IV SOLN: 250.0000 mL | INTRAVENOUS | Status: DC

## 2013-10-05 MED ORDER — CEFAZOLIN SODIUM-DEXTROSE 2-3 GM-% IV SOLR
2.0000 g | Freq: Three times a day (TID) | INTRAVENOUS | Status: DC
  Administered 2013-10-05: 2 g via INTRAVENOUS
  Filled 2013-10-05 (×2): qty 50

## 2013-10-05 MED ORDER — METOCLOPRAMIDE HCL 5 MG/ML IJ SOLN
INTRAMUSCULAR | Status: DC | PRN
  Administered 2013-10-05: 10 mg via INTRAVENOUS

## 2013-10-05 MED ORDER — KETOROLAC TROMETHAMINE 30 MG/ML IJ SOLN
30.0000 mg | Freq: Four times a day (QID) | INTRAMUSCULAR | Status: DC
  Administered 2013-10-05: 30 mg via INTRAVENOUS
  Filled 2013-10-05: qty 1

## 2013-10-05 MED ORDER — NEOSTIGMINE METHYLSULFATE 10 MG/10ML IV SOLN
INTRAVENOUS | Status: AC
  Filled 2013-10-05: qty 1

## 2013-10-05 MED ORDER — PROPOFOL 10 MG/ML IV BOLUS
INTRAVENOUS | Status: AC
  Filled 2013-10-05: qty 20

## 2013-10-05 MED ORDER — HYDROMORPHONE HCL PF 1 MG/ML IJ SOLN: 1.0000 mg | INTRAMUSCULAR | Status: DC | PRN

## 2013-10-05 MED ORDER — ACETAMINOPHEN 650 MG RE SUPP
650.0000 mg | RECTAL | Status: DC | PRN
Start: 2013-10-05 — End: 2013-10-05

## 2013-10-05 MED ORDER — CYCLOBENZAPRINE HCL 10 MG PO TABS: 10.0000 mg | ORAL_TABLET | Freq: Three times a day (TID) | ORAL | Status: DC | PRN

## 2013-10-05 MED ORDER — ROCURONIUM BROMIDE 100 MG/10ML IV SOLN
INTRAVENOUS | Status: DC | PRN
  Administered 2013-10-05: 50 mg via INTRAVENOUS

## 2013-10-05 MED ORDER — KCL IN DEXTROSE-NACL 20-5-0.45 MEQ/L-%-% IV SOLN
80.0000 mL/h | INTRAVENOUS | Status: DC
  Filled 2013-10-05 (×2): qty 1000

## 2013-10-05 MED ORDER — SODIUM CHLORIDE 0.9 % IR SOLN
Status: DC | PRN
  Administered 2013-10-05: 10:00:00

## 2013-10-05 MED ORDER — OXYCODONE HCL 5 MG PO TABS
5.0000 mg | ORAL_TABLET | Freq: Once | ORAL | Status: AC | PRN
  Administered 2013-10-05: 5 mg via ORAL

## 2013-10-05 MED ORDER — METOCLOPRAMIDE HCL 5 MG/ML IJ SOLN
INTRAMUSCULAR | Status: AC
  Filled 2013-10-05: qty 2

## 2013-10-05 MED ORDER — MIDAZOLAM HCL 2 MG/2ML IJ SOLN
INTRAMUSCULAR | Status: AC
  Filled 2013-10-05: qty 2

## 2013-10-05 MED ORDER — ARTIFICIAL TEARS OP OINT
TOPICAL_OINTMENT | OPHTHALMIC | Status: DC | PRN
  Administered 2013-10-05: 1 via OPHTHALMIC

## 2013-10-05 MED ORDER — HYDROCHLOROTHIAZIDE 12.5 MG PO CAPS
12.5000 mg | ORAL_CAPSULE | Freq: Every day | ORAL | Status: DC
  Administered 2013-10-05: 12.5 mg via ORAL
  Filled 2013-10-05: qty 1

## 2013-10-05 MED ORDER — PANTOPRAZOLE SODIUM 40 MG IV SOLR
40.0000 mg | Freq: Every day | INTRAVENOUS | Status: DC
  Filled 2013-10-05: qty 40

## 2013-10-05 MED ORDER — ONDANSETRON HCL 4 MG/2ML IJ SOLN
INTRAMUSCULAR | Status: AC
  Filled 2013-10-05: qty 2

## 2013-10-05 MED ORDER — OXYCODONE HCL 5 MG/5ML PO SOLN: 5.0000 mg | Freq: Once | ORAL | Status: AC | PRN

## 2013-10-05 MED ORDER — GLYCOPYRROLATE 0.2 MG/ML IJ SOLN
INTRAMUSCULAR | Status: AC
Start: 2013-10-05 — End: 2013-10-05
  Filled 2013-10-05: qty 4

## 2013-10-05 MED ORDER — PHENOL 1.4 % MT LIQD: 1.0000 | OROMUCOSAL | Status: DC | PRN

## 2013-10-05 MED ORDER — DEXAMETHASONE SODIUM PHOSPHATE 4 MG/ML IJ SOLN: 4.0000 mg | Freq: Four times a day (QID) | INTRAMUSCULAR | Status: DC

## 2013-10-05 MED ORDER — LIDOCAINE HCL (CARDIAC) 20 MG/ML IV SOLN
INTRAVENOUS | Status: DC | PRN
  Administered 2013-10-05: 50 mg via INTRAVENOUS

## 2013-10-05 MED ORDER — MENTHOL 3 MG MT LOZG: 1.0000 | LOZENGE | OROMUCOSAL | Status: DC | PRN

## 2013-10-05 MED ORDER — VECURONIUM BROMIDE 10 MG IV SOLR
INTRAVENOUS | Status: DC | PRN
  Administered 2013-10-05 (×2): 2 mg via INTRAVENOUS

## 2013-10-05 MED ORDER — FENTANYL CITRATE 0.05 MG/ML IJ SOLN
INTRAMUSCULAR | Status: DC | PRN
  Administered 2013-10-05 (×2): 50 ug via INTRAVENOUS
  Administered 2013-10-05: 100 ug via INTRAVENOUS
  Administered 2013-10-05 (×3): 50 ug via INTRAVENOUS

## 2013-10-05 MED ORDER — THROMBIN 5000 UNITS EX SOLR
CUTANEOUS | Status: DC | PRN
  Administered 2013-10-05 (×2): 5000 [IU] via TOPICAL

## 2013-10-05 MED ORDER — GLYCOPYRROLATE 0.2 MG/ML IJ SOLN
INTRAMUSCULAR | Status: DC | PRN
Start: 2013-10-05 — End: 2013-10-05
  Administered 2013-10-05: .8 mg via INTRAVENOUS

## 2013-10-05 MED ORDER — FENTANYL CITRATE 0.05 MG/ML IJ SOLN
INTRAMUSCULAR | Status: AC
  Filled 2013-10-05: qty 5

## 2013-10-05 MED ORDER — HYDROMORPHONE HCL PF 1 MG/ML IJ SOLN: 0.2500 mg | INTRAMUSCULAR | Status: DC | PRN

## 2013-10-05 MED ORDER — DEXAMETHASONE 4 MG PO TABS
4.0000 mg | ORAL_TABLET | Freq: Four times a day (QID) | ORAL | Status: DC
  Administered 2013-10-05: 4 mg via ORAL
  Filled 2013-10-05: qty 1

## 2013-10-05 MED ORDER — 0.9 % SODIUM CHLORIDE (POUR BTL) OPTIME
TOPICAL | Status: DC | PRN
  Administered 2013-10-05: 1000 mL

## 2013-10-05 MED ORDER — ROCURONIUM BROMIDE 50 MG/5ML IV SOLN
INTRAVENOUS | Status: AC
  Filled 2013-10-05: qty 1

## 2013-10-05 MED ORDER — LACTATED RINGERS IV SOLN
INTRAVENOUS | Status: DC | PRN
  Administered 2013-10-05 (×2): via INTRAVENOUS

## 2013-10-05 MED ORDER — OXYCODONE HCL 5 MG PO TABS
ORAL_TABLET | ORAL | Status: AC
  Filled 2013-10-05: qty 1

## 2013-10-05 MED ORDER — LIDOCAINE HCL (CARDIAC) 20 MG/ML IV SOLN
INTRAVENOUS | Status: AC
  Filled 2013-10-05: qty 5

## 2013-10-05 MED ORDER — KETOROLAC TROMETHAMINE 30 MG/ML IJ SOLN
INTRAMUSCULAR | Status: AC
  Filled 2013-10-05: qty 1

## 2013-10-05 MED ORDER — NEOSTIGMINE METHYLSULFATE 10 MG/10ML IV SOLN
INTRAVENOUS | Status: DC | PRN
  Administered 2013-10-05: 5 mg via INTRAVENOUS

## 2013-10-05 MED ORDER — ONDANSETRON HCL 4 MG/2ML IJ SOLN: 4.0000 mg | INTRAMUSCULAR | Status: DC | PRN

## 2013-10-05 MED ORDER — KETOROLAC TROMETHAMINE 30 MG/ML IJ SOLN
INTRAMUSCULAR | Status: DC | PRN
  Administered 2013-10-05: 30 mg via INTRAVENOUS

## 2013-10-05 MED ORDER — ONDANSETRON HCL 4 MG/2ML IJ SOLN: 4.0000 mg | Freq: Once | INTRAMUSCULAR | Status: DC | PRN

## 2013-10-05 MED ORDER — STERILE WATER FOR INJECTION IJ SOLN
INTRAMUSCULAR | Status: AC
  Filled 2013-10-05: qty 10

## 2013-10-05 MED ORDER — BUPIVACAINE HCL (PF) 0.5 % IJ SOLN
INTRAMUSCULAR | Status: DC | PRN
  Administered 2013-10-05: 20 mL

## 2013-10-05 MED ORDER — SODIUM CHLORIDE 0.9 % IJ SOLN: 3.0000 mL | Freq: Two times a day (BID) | INTRAMUSCULAR | Status: DC

## 2013-10-05 MED ORDER — LISINOPRIL-HYDROCHLOROTHIAZIDE 10-12.5 MG PO TABS: 1.0000 | ORAL_TABLET | Freq: Every day | ORAL | Status: DC

## 2013-10-05 MED ORDER — ARTIFICIAL TEARS OP OINT
TOPICAL_OINTMENT | OPHTHALMIC | Status: AC
  Filled 2013-10-05: qty 3.5

## 2013-10-05 MED ORDER — ONDANSETRON HCL 4 MG/2ML IJ SOLN
INTRAMUSCULAR | Status: DC | PRN
  Administered 2013-10-05: 4 mg via INTRAVENOUS

## 2013-10-05 MED ORDER — LACTATED RINGERS IV SOLN
INTRAVENOUS | Status: DC
  Administered 2013-10-05: 09:00:00 via INTRAVENOUS

## 2013-10-05 SURGICAL SUPPLY — 52 items
ADH SKN CLS APL DERMABOND .7 (GAUZE/BANDAGES/DRESSINGS) ×1
APL SKNCLS STERI-STRIP NONHPOA (GAUZE/BANDAGES/DRESSINGS) ×1
BAG DECANTER FOR FLEXI CONT (MISCELLANEOUS) ×3 IMPLANT
BENZOIN TINCTURE PRP APPL 2/3 (GAUZE/BANDAGES/DRESSINGS) ×4 IMPLANT
BLADE SURG ROTATE 9660 (MISCELLANEOUS) ×3 IMPLANT
BRUSH SCRUB EZ PLAIN DRY (MISCELLANEOUS) ×3 IMPLANT
BUR CUTTER 7.0 ROUND (BURR) ×3 IMPLANT
BUR MATCHSTICK NEURO 3.0 LAGG (BURR) ×3 IMPLANT
CANISTER SUCT 3000ML (MISCELLANEOUS) ×3 IMPLANT
CLOSURE WOUND 1/2 X4 (GAUZE/BANDAGES/DRESSINGS) ×1
CONT SPEC 4OZ CLIKSEAL STRL BL (MISCELLANEOUS) ×3 IMPLANT
DERMABOND ADVANCED (GAUZE/BANDAGES/DRESSINGS) ×2
DERMABOND ADVANCED .7 DNX12 (GAUZE/BANDAGES/DRESSINGS) ×1 IMPLANT
DRAPE LAPAROTOMY 100X72X124 (DRAPES) ×3 IMPLANT
DRAPE MICROSCOPE LEICA (MISCELLANEOUS) ×3 IMPLANT
DRAPE SURG 17X23 STRL (DRAPES) ×6 IMPLANT
DRSG OPSITE POSTOP 4X6 (GAUZE/BANDAGES/DRESSINGS) ×2 IMPLANT
DRSG TELFA 3X8 NADH (GAUZE/BANDAGES/DRESSINGS) ×3 IMPLANT
DURAPREP 26ML APPLICATOR (WOUND CARE) ×3 IMPLANT
ELECT BLADE 4.0 EZ CLEAN MEGAD (MISCELLANEOUS) ×3
ELECT REM PT RETURN 9FT ADLT (ELECTROSURGICAL) ×3
ELECTRODE BLDE 4.0 EZ CLN MEGD (MISCELLANEOUS) IMPLANT
ELECTRODE REM PT RTRN 9FT ADLT (ELECTROSURGICAL) ×1 IMPLANT
GAUZE SPONGE 4X4 12PLY STRL (GAUZE/BANDAGES/DRESSINGS) ×3 IMPLANT
GAUZE SPONGE 4X4 16PLY XRAY LF (GAUZE/BANDAGES/DRESSINGS) IMPLANT
GLOVE ECLIPSE 8.0 STRL XLNG CF (GLOVE) ×3 IMPLANT
GOWN STRL REUS W/ TWL LRG LVL3 (GOWN DISPOSABLE) IMPLANT
GOWN STRL REUS W/ TWL XL LVL3 (GOWN DISPOSABLE) ×1 IMPLANT
GOWN STRL REUS W/TWL 2XL LVL3 (GOWN DISPOSABLE) IMPLANT
GOWN STRL REUS W/TWL LRG LVL3 (GOWN DISPOSABLE)
GOWN STRL REUS W/TWL XL LVL3 (GOWN DISPOSABLE) ×3
KIT BASIN OR (CUSTOM PROCEDURE TRAY) ×3 IMPLANT
KIT ROOM TURNOVER OR (KITS) ×3 IMPLANT
NDL SPNL 22GX3.5 QUINCKE BK (NEEDLE) ×2 IMPLANT
NEEDLE HYPO 22GX1.5 SAFETY (NEEDLE) ×3 IMPLANT
NEEDLE SPNL 22GX3.5 QUINCKE BK (NEEDLE) ×6 IMPLANT
NS IRRIG 1000ML POUR BTL (IV SOLUTION) ×3 IMPLANT
PACK LAMINECTOMY NEURO (CUSTOM PROCEDURE TRAY) ×3 IMPLANT
PAD ARMBOARD 7.5X6 YLW CONV (MISCELLANEOUS) ×9 IMPLANT
PAD DRESSING TELFA 3X8 NADH (GAUZE/BANDAGES/DRESSINGS) ×1 IMPLANT
PATTIES SURGICAL .75X.75 (GAUZE/BANDAGES/DRESSINGS) ×3 IMPLANT
RUBBERBAND STERILE (MISCELLANEOUS) ×6 IMPLANT
SPONGE SURGIFOAM ABS GEL SZ50 (HEMOSTASIS) ×3 IMPLANT
STRIP CLOSURE SKIN 1/2X4 (GAUZE/BANDAGES/DRESSINGS) ×2 IMPLANT
SUT PROLENE 0 CT 1 30 (SUTURE) ×2 IMPLANT
SUT VIC AB 2-0 OS6 18 (SUTURE) ×9 IMPLANT
SUT VIC AB 3-0 CP2 18 (SUTURE) ×3 IMPLANT
SYR 20ML ECCENTRIC (SYRINGE) ×3 IMPLANT
TAPE STRIPS DRAPE STRL (GAUZE/BANDAGES/DRESSINGS) ×2 IMPLANT
TOWEL OR 17X24 6PK STRL BLUE (TOWEL DISPOSABLE) ×3 IMPLANT
TOWEL OR 17X26 10 PK STRL BLUE (TOWEL DISPOSABLE) ×3 IMPLANT
WATER STERILE IRR 1000ML POUR (IV SOLUTION) ×3 IMPLANT

## 2013-10-05 NOTE — Transfer of Care (Signed)
Immediate Anesthesia Transfer of Care Note  Patient: Tammy Briggs  Procedure(s) Performed: Procedure(s) with comments: LUMBAR LAMINECTOMY/DECOMPRESSION MICRODISCECTOMY 1 LEVEL L5-S1 (Left) - LUMBAR LAMINECTOMY/DECOMPRESSION MICRODISCECTOMY 1 LEVEL L5-S1  Patient Location: PACU  Anesthesia Type:General  Level of Consciousness: awake, alert  and oriented  Airway & Oxygen Therapy: Patient Spontanous Breathing and Patient connected to nasal cannula oxygen  Post-op Assessment: Report given to PACU RN, Post -op Vital signs reviewed and stable and Patient moving all extremities X 4  Post vital signs: Reviewed and stable  Complications: No apparent anesthesia complications

## 2013-10-05 NOTE — Progress Notes (Signed)
Pt doing well. Pt and family given D/C instructions with Rx's, verbal understanding was provided. Pt's IV was removed prior to D/C. Pt's incision was covered with Honeycomb dressing with a scant amount of blood. Pt D/C'd home via wheelchair @ 1735 per MD order. Pt was stable @ D/C and has no other needs at this time. Rema FendtAshley Arush Gatliff, RN

## 2013-10-05 NOTE — Op Note (Signed)
Preop diagnosis: Herniated disc L5-S1 left Postoperative diagnosis: Same Procedure: Left L5-S1 intralaminar laminotomy for excision of herniated disc with operating microscope Surgeon: Jash Wahlen Assistant: Venetia MaxonStern  After and placed the prone position the patient's back was prepped and draped in the usual sterile fashion. Localizing x-rays taken prior to incision to identify the appropriate level. Midline incision was made above the spinous processes of L5 and S1. Using Bovie cutting current the incision was carried on the spinous processes. Subperiosteal dissection was then carried out on the left side the spinous processes and lamina and self-retaining tract was placed for exposure. X-ray showed approach the appropriate level. Using the high-speed drill the inferior one third of the L5 lamina the medial one third of the facet joint the superior one third of the S1 lamina were removed. Residual bone and ligamentum flavum removed in a piecemeal fashion. The microscope was draped brought in the field and used for the remainder of the case. Using microdissection technique the lateral aspect of the thecal sac and S1 nerve were identified. Further coagulation was carried out down to the floor the canal to identify the L5-S1 disc which be tremendously herniated. There is also an obvious large free fragment of disc which was removed. The disc space was then incised a 15 blade and thoroughly cleaned out with pituitary rongeurs and curettes. At this time inspection was carried out in all directions for any evidence of residual compression and none could be identified. Large amounts of irrigation were carried out and any bleeding control proper coagulation Gelfoam. The was then closed in multiple layers of Vicryl on the muscle fascia subcutaneous and subcutaneous take your tissues. A running locking Prolene was placed on the skin. A sterile dressing was applied and the patient was extubated and taken to recovery room in  stable condition.

## 2013-10-05 NOTE — H&P (Signed)
Tammy Briggs is an 43 y.o. female.   Chief Complaint: Left leg pain HPI: The patient is a 43 year old female who evaluated in the office for left leg pain of 8-10 weeks' duration. There is no inciting event. She saw her medical doctor who tried Naprosyn which led to GI difficulty that she try meloxicam without relief. She taken Flexeril Vicodin with no improvement. An MRI scan was done she now comes for evaluation. The patient's MRI scan was reviewed which showed a very large disc herniation at L5-S1 on the left. She tried additional conservative therapy with oral steroids and this gave her no improvement. She therefore requested surgery now comes for a lumbar microdiscectomy at L5-S1 on the left. I had a long discussion with her regarding the risks and benefits of surgical intervention. The risks discussed include but are not limited to bleeding infection weakness paralysis spinal fluid leak coma and death. We have discussed alternative methods of therapy along the risks and benefits of nonintervention. She's had the opportunity to ask numerous questions and appears to understand. With this information in hand she has requested we proceed with surgery.  Past Medical History  Diagnosis Date  . Hypertension     takes Lisinopril-HCTZ daily  . GERD (gastroesophageal reflux disease)     takes Nexium daily  . Muscle spasm     takes Flexeril daily as needed  . PONV (postoperative nausea and vomiting)   . Pneumonia hx of-101yr ago  . History of bronchitis 3-29yrs ago  . Weakness     numbness and tingling left leg  . Joint swelling     right   . Constipation   . History of kidney stones     Past Surgical History  Procedure Laterality Date  . Myringotomy      x 4  . Adeoinectomy    . Hpv removed  age 76  . Cholecystectomy  52yrs ago    Family History  Problem Relation Age of Onset  . Hypertension Mother   . Hypertension Father   . Diabetes Father    Social History:  reports that she  has quit smoking. She does not have any smokeless tobacco history on file. She reports that she does not drink alcohol or use illicit drugs.  Allergies: No Known Allergies  Medications Prior to Admission  Medication Sig Dispense Refill  . cyclobenzaprine (FLEXERIL) 10 MG tablet Take 10 mg by mouth 3 (three) times daily as needed for muscle spasms.      Marland Kitchen esomeprazole (NEXIUM) 20 MG capsule Take 20 mg by mouth daily at 12 noon.      Marland Kitchen HYDROcodone-acetaminophen (NORCO/VICODIN) 5-325 MG per tablet Take 1-2 tablets by mouth every 6 (six) hours as needed for moderate pain.      . mupirocin ointment (BACTROBAN) 2 % Place 1 application into the nose 2 (two) times daily.      Marland Kitchen oxyCODONE (ROXICODONE) 5 MG immediate release tablet Take 1 tablet (5 mg total) by mouth every 4 (four) hours as needed for severe pain.  30 tablet  0  . Oxycodone HCl 10 MG TABS Take 10 mg by mouth every 4 (four) hours as needed (for pain).      . vitamin C (ASCORBIC ACID) 500 MG tablet Take 1,000 mg by mouth daily.      Marland Kitchen lisinopril-hydrochlorothiazide (PRINZIDE,ZESTORETIC) 10-12.5 MG per tablet Take 1 tablet by mouth daily.        No results found for this or any previous  visit (from the past 48 hour(s)). No results found.  Pertinent items are noted in HPI.  There were no vitals taken for this visit.  The patient is awake or and oriented. She is no facial asymmetry. Reflexes are decreased but equal. Her gait is antalgic. Her strength is intact. Assessment/Plan Impression is that of a herniated disc L5-S1 on the left. The plan is for a left L5-S1 microdiscectomy.  Reinaldo MeekerKRITZER,Myka Hitz O, MD 10/05/2013, 10:16 AM

## 2013-10-05 NOTE — Anesthesia Procedure Notes (Signed)
Procedure Name: Intubation Date/Time: 10/05/2013 10:36 AM Performed by: Vita BarleyURNER, Jaxtyn Linville E Pre-anesthesia Checklist: Patient identified, Emergency Drugs available, Suction available and Patient being monitored Patient Re-evaluated:Patient Re-evaluated prior to inductionOxygen Delivery Method: Circle system utilized Preoxygenation: Pre-oxygenation with 100% oxygen Intubation Type: IV induction Ventilation: Mask ventilation without difficulty and Oral airway inserted - appropriate to patient size Laryngoscope Size: Hyacinth MeekerMiller and 2 Grade View: Grade I Tube type: Oral Tube size: 7.5 mm Number of attempts: 1 Airway Equipment and Method: Stylet and Oral airway Placement Confirmation: ETT inserted through vocal cords under direct vision,  positive ETCO2 and breath sounds checked- equal and bilateral Secured at: 22 cm Tube secured with: Tape Dental Injury: Teeth and Oropharynx as per pre-operative assessment

## 2013-10-05 NOTE — Discharge Instructions (Signed)
Wound Care °Keep incision covered and dry for one week. °You may shower from the neck down. °You may remove outer bandage after one week.  °Do not put any creams, lotions, or ointments on incision. °Leave steri-strips on neck.  They will fall off by themselves. °Activity °Walk each and every day, increasing distance each day. °No lifting greater than 5 lbs.  Avoid bending, arching, and twisting. °No driving or riding in car until further notice at follow up appointment. °If provided with back brace, wear when out of bed.  It is not necessary to wear brace in bed. °Diet °Resume your normal diet.  °Return to Work °Will be discussed at you follow up appointment. °Call Your Doctor If Any of These Occur °Redness, drainage, or swelling at the wound.  °Temperature greater than 101 degrees. °Severe pain not relieved by pain medication. °Incision starts to come apart. °Follow Up Appt °Call today for appointment in 1-2 weeks ([phone]) or for problems.  If you have any hardware placed in your spine, you will need an x-ray before your appointment. ° ° °Laminectomy - Laminotomy - Discectomy °Your surgeon has decided that a laminectomy (entire lamina removal) or laminotomy (partial lamina removal) is the best treatment for your back problem. These procedures involve removal of bone to relieve pressure on nerve roots. It allows the surgeon access to parts of the spine where other problems are located. This could be an injured disc (the cartilage-like structures located between the bones of the back). In this surgery your surgeon removes a part of the boney arch that surrounds your spinal canal. This may be compressing nerve roots. In some cases, the surgeon will remove the disc and fuse (stick together) vertebral bodies (the bones of your back) to make the spine more stable. The type of procedure you will need is usually decided prior to surgery, however modifications may be necessary. The time in surgery depends on the findings  in surgery and the procedure necessary to correct the problems. °DISCECTOMY °For people with disc problems, the surgeon removes the portion of the disc that is causing the pressure on the nerve root. Some surgeons perform a micro (small) discectomy, which may require removal of only a small portion of the lamina. A disc nucleus (center) may also be removed either through a needle (percutaneous discectomy) or by injecting an enzyme called chymopapain into the disc. Chymopapain is an enzyme that dissolves the disc. For people with back instability, the surgeon fuses vertebrae that are next to each other with tiny pieces of bone. These are used as bone grafts on the facets, or between the vertebrae. When this heals, the bones will no longer be able to move. These bone chips are often taken from the pelvic bones. Bones and bone grafts grow into one unit, stabilizing the segments of the spinal column. °LET YOUR CAREGIVER KNOW ABOUT: °· Allergies. °· Medicines taken including herbs, eyedrops, over-the-counter medicines, and creams. °· Use of steroids (by mouth or creams). °· Previous problems with anesthetics or numbing medicine. °· Possibility of pregnancy, if this applies. °· History of blood clots (thrombophlebitis). °· History of bleeding or blood problems. °· Previous surgery. °· Other health problems. °RISKS AND COMPLICATIONS °Your caregiver will discuss possible risks and complications with you before surgery. In addition to the usual risks of anesthesia, other common risks and complications include: °· Blood loss and replacement. °· Temporary increase in pain due to surgery. °· Uncorrected back pain. °· Infection. °· New nerve damage (tingling,   numbness, and pain). °BEFORE THE PROCEDURE °· Stop smoking at least 1 week prior to surgery. This lowers risk during surgery. °· Your caregiver may advise that you stop taking certain medicines that may affect the outcome of the surgery and your ability to heal. For  example, you may need to stop taking anti-inflammatories, such as aspirin, because of possible bleeding problems. Other medicines may have interactions with anesthesia. °· Tell your caregiver if you have been on steroids for long periods of time. Often, additional steroids are administered intravenously before and during the procedure to prevent complications. °· You should be present 60 minutes prior to your procedure or as directed. °AFTER THE PROCEDURE °After surgery, you will be taken to the recovery area where a nurse will watch and check your progress. Generally, you will be allowed to go home within 1 week barring other problems. °HOME CARE INSTRUCTIONS  °· Check the surgical cut (incision) twice a day for signs of infection. Some signs may include a bad smelling, greenish or yellowish discharge from the wound; increased pain or increased redness over the incision site; an opening of the incision; flu-like symptoms; or a temperature above 101.5° F (38.6° C). °· Change your bandages in about 24 to 36 hours following surgery or as directed. °· You may shower once the bandage is removed or as directed. Avoid bathtubs, swimming pools, and hot tubs for 3 weeks or until your incision has healed completely. If you have stitches (sutures) or staples they may be removed 2 to 3 weeks after surgery, or as directed by your caregiver. °· Follow your caregiver's instructions for activities, exercises, and physical therapy. °· Weight reduction may be beneficial if you are overweight. °· Walking is permitted. You may use a treadmill without an incline. Cut down on activities if you have discomfort. You may also go up and down stairs as tolerated. °· Do not lift anything heavier than 10 to 15 pounds. Avoid bending or twisting at the waist. Always bend your knees. °· Maintain strength and range of motion as instructed. °· No driving is permitted for 2 to 3 weeks, or as directed by your caregiver. You may be a passenger for 20  to 30 minute trips. Lying back in the passenger seat may be more comfortable for you. °· Limit your sitting to 20 to 30 minute intervals. You should lie down or walk in between sitting periods. There are no limitations for sitting in a recliner chair. °· Only take over-the-counter or prescription medicines for pain, discomfort, or fever as directed by your caregiver. °SEEK MEDICAL CARE IF:  °· There is increased bleeding (more than a small spot) from the wound. °· You notice redness, swelling, or increasing pain in the wound. °· Pus is coming from the wound. °· An unexplained oral temperature above 102° F (38.9° C) develops. °· You notice a bad smell coming from the wound or dressing. °SEEK IMMEDIATE MEDICAL CARE IF:  °· You develop a rash. °· You have difficulty breathing. °· You have any allergic problems. °Document Released: 02/09/2000 Document Revised: 06/28/2013 Document Reviewed: 12/08/2007 °[employer]® Patient Information ©2015 [employer]. This information is not intended to replace advice given to you by your health care provider. Make sure you discuss any questions you have with your health care provider. ° °

## 2013-10-05 NOTE — Progress Notes (Signed)
Called for sign out--okay to go to room per Dr.Joslin.

## 2013-10-05 NOTE — Anesthesia Postprocedure Evaluation (Signed)
  Anesthesia Post-op Note  Patient: Tammy PeltChristy S Briggs  Procedure(s) Performed: Procedure(s) with comments: LUMBAR LAMINECTOMY/DECOMPRESSION MICRODISCECTOMY 1 LEVEL L5-S1 (Left) - LUMBAR LAMINECTOMY/DECOMPRESSION MICRODISCECTOMY 1 LEVEL L5-S1  Patient Location: PACU  Anesthesia Type:General  Level of Consciousness: awake, alert  and oriented  Airway and Oxygen Therapy: Patient Spontanous Breathing and Patient connected to nasal cannula oxygen  Post-op Pain: mild  Post-op Assessment: Post-op Vital signs reviewed, Patient's Cardiovascular Status Stable, Respiratory Function Stable, Patent Airway and Pain level controlled  Post-op Vital Signs: stable  Last Vitals:  Filed Vitals:   10/05/13 1326  BP: 139/82  Pulse: 97  Temp: 36.4 C  Resp: 16    Complications: No apparent anesthesia complications

## 2013-10-05 NOTE — Discharge Summary (Signed)
  Physician Discharge Summary  Patient ID: Tammy PeltChristy S Lambright MRN: [id number] DOB/AGE: [birth year] 43 y.o.  Admit date: 10/05/2013 Discharge date: 10/05/2013  Admission Diagnoses:  Discharge Diagnoses:  Active Problems:   Herniated lumbar intervertebral disc   Discharged Condition: good  Hospital Course: Surgery today for herniated lumbar disc. Did great. Pain gone post op. Ambukated well. Home same day, specific instructions given.  Consults: None  Significant Diagnostic Studies: none  Treatments: surgery: L5S1 microdiscectomy  Discharge Exam: Blood pressure 138/58, pulse 115, temperature 97.6 F (36.4 C), resp. rate 18, SpO2 93.00%. Incision/Wound:clean and dry; no new neuro issues  Disposition: 01-Home or Self Care     Medication List    ASK your doctor about these medications       cyclobenzaprine 10 MG tablet  Commonly known as:  FLEXERIL  Take 10 mg by mouth 3 (three) times daily as needed for muscle spasms.     esomeprazole 20 MG capsule  Commonly known as:  NEXIUM  Take 20 mg by mouth daily at 12 noon.     HYDROcodone-acetaminophen 5-325 MG per tablet  Commonly known as:  NORCO/VICODIN  Take 1-2 tablets by mouth every 6 (six) hours as needed for moderate pain.     lisinopril-hydrochlorothiazide 10-12.5 MG per tablet  Commonly known as:  PRINZIDE,ZESTORETIC  Take 1 tablet by mouth daily.     mupirocin ointment 2 %  Commonly known as:  BACTROBAN  Place 1 application into the nose 2 (two) times daily.     Oxycodone HCl 10 MG Tabs  Take 10 mg by mouth every 4 (four) hours as needed (for pain).     oxyCODONE 5 MG immediate release tablet  Commonly known as:  ROXICODONE  Take 1 tablet (5 mg total) by mouth every 4 (four) hours as needed for severe pain.     vitamin C 500 MG tablet  Commonly known as:  ASCORBIC ACID  Take 1,000 mg by mouth daily.         At home rest most of the time. Get up 9 or 10 times each day and take a 15 or 20 minute walk.  No riding in the car and to your first postoperative appointment. If you have neck surgery you may shower from the chest down starting on the third postoperative day. If you had back surgery he may start showering on the third postoperative day with saran wrap wrapped around your incisional area 3 times. After the shower remove the saran wrap. Take pain medicine as needed and other medications as instructed. Call my office for an appointment.  SignedReinaldo Meeker: Janisha Bueso O, MD 10/05/2013, 4:48 PM

## 2013-10-05 NOTE — Anesthesia Preprocedure Evaluation (Addendum)
Anesthesia Evaluation  Patient identified by MRN, date of birth, ID band Patient awake    Reviewed: Allergy & Precautions, H&P , NPO status , Patient's Chart, lab work & pertinent test results  History of Anesthesia Complications (+) PONV and history of anesthetic complications  Airway Mallampati: II TM Distance: >3 FB Neck ROM: Full    Dental  (+) Dental Advisory Given, Teeth Intact   Pulmonary former smoker,  breath sounds clear to auscultation        Cardiovascular hypertension, Pt. on medications Rhythm:Regular Rate:Normal     Neuro/Psych    GI/Hepatic GERD-  Medicated,  Endo/Other  Morbid obesity  Renal/GU      Musculoskeletal   Abdominal   Peds  Hematology   Anesthesia Other Findings   Reproductive/Obstetrics                          Anesthesia Physical Anesthesia Plan  ASA: II  Anesthesia Plan: General   Post-op Pain Management:    Induction: Intravenous  Airway Management Planned: Oral ETT  Additional Equipment:   Intra-op Plan:   Post-operative Plan: Extubation in OR  Informed Consent: I have reviewed the patients History and Physical, chart, labs and discussed the procedure including the risks, benefits and alternatives for the proposed anesthesia with the patient or authorized representative who has indicated his/her understanding and acceptance.   Dental advisory given  Plan Discussed with: CRNA, Anesthesiologist and Surgeon  Anesthesia Plan Comments: (Obesity GERD Hypertension H/O Post-op N/V  Plan GA with oral ETT   Kipp Broodavid Damara Klunder, MD)       Anesthesia Quick Evaluation

## 2013-10-07 ENCOUNTER — Encounter (HOSPITAL_COMMUNITY): Payer: Self-pay | Admitting: Neurosurgery

## 2013-11-28 ENCOUNTER — Other Ambulatory Visit: Payer: Self-pay | Admitting: Emergency Medicine

## 2014-04-07 ENCOUNTER — Ambulatory Visit (INDEPENDENT_AMBULATORY_CARE_PROVIDER_SITE_OTHER): Payer: BLUE CROSS/BLUE SHIELD | Admitting: Family Medicine

## 2014-04-07 VITALS — BP 135/96 | HR 111 | Temp 98.1°F | Resp 16 | Ht 66.0 in | Wt 320.8 lb

## 2014-04-07 DIAGNOSIS — I10 Essential (primary) hypertension: Secondary | ICD-10-CM

## 2014-04-07 DIAGNOSIS — M62838 Other muscle spasm: Secondary | ICD-10-CM

## 2014-04-07 DIAGNOSIS — M5481 Occipital neuralgia: Secondary | ICD-10-CM

## 2014-04-07 DIAGNOSIS — M129 Arthropathy, unspecified: Secondary | ICD-10-CM

## 2014-04-07 DIAGNOSIS — R0602 Shortness of breath: Secondary | ICD-10-CM

## 2014-04-07 DIAGNOSIS — M1710 Unilateral primary osteoarthritis, unspecified knee: Secondary | ICD-10-CM

## 2014-04-07 DIAGNOSIS — M6249 Contracture of muscle, multiple sites: Secondary | ICD-10-CM

## 2014-04-07 DIAGNOSIS — K047 Periapical abscess without sinus: Secondary | ICD-10-CM

## 2014-04-07 DIAGNOSIS — R519 Headache, unspecified: Secondary | ICD-10-CM

## 2014-04-07 DIAGNOSIS — R51 Headache: Secondary | ICD-10-CM

## 2014-04-07 DIAGNOSIS — Z5181 Encounter for therapeutic drug level monitoring: Secondary | ICD-10-CM

## 2014-04-07 LAB — POCT CBC
Granulocyte percent: 81.9 %G — AB (ref 37–80)
HCT, POC: 37.8 % (ref 37.7–47.9)
Hemoglobin: 12.1 g/dL — AB (ref 12.2–16.2)
Lymph, poc: 1.5 (ref 0.6–3.4)
MCH: 25.9 pg — AB (ref 27–31.2)
MCHC: 32 g/dL (ref 31.8–35.4)
MCV: 81.1 fL (ref 80–97)
MID (CBC): 0.6 (ref 0–0.9)
MPV: 7.8 fL (ref 0–99.8)
POC Granulocyte: 9.3 — AB (ref 2–6.9)
POC LYMPH PERCENT: 12.8 %L (ref 10–50)
POC MID %: 5.3 %M (ref 0–12)
Platelet Count, POC: 335 10*3/uL (ref 142–424)
RBC: 4.66 M/uL (ref 4.04–5.48)
RDW, POC: 15.1 %
WBC: 11.4 10*3/uL — AB (ref 4.6–10.2)

## 2014-04-07 LAB — COMPREHENSIVE METABOLIC PANEL
ALK PHOS: 113 U/L (ref 39–117)
ALT: 13 U/L (ref 0–35)
AST: 14 U/L (ref 0–37)
Albumin: 3.7 g/dL (ref 3.5–5.2)
BUN: 16 mg/dL (ref 6–23)
CO2: 28 mEq/L (ref 19–32)
Calcium: 8.9 mg/dL (ref 8.4–10.5)
Chloride: 100 mEq/L (ref 96–112)
Creat: 0.65 mg/dL (ref 0.50–1.10)
Glucose, Bld: 88 mg/dL (ref 70–99)
Potassium: 3.5 mEq/L (ref 3.5–5.3)
SODIUM: 139 meq/L (ref 135–145)
TOTAL PROTEIN: 6.2 g/dL (ref 6.0–8.3)
Total Bilirubin: 0.3 mg/dL (ref 0.2–1.2)

## 2014-04-07 LAB — POCT SEDIMENTATION RATE: POCT SED RATE: 47 mm/hr — AB (ref 0–22)

## 2014-04-07 LAB — GLUCOSE, POCT (MANUAL RESULT ENTRY): POC Glucose: 102 mg/dl — AB (ref 70–99)

## 2014-04-07 MED ORDER — AMOXICILLIN-POT CLAVULANATE 875-125 MG PO TABS: 1.0000 | ORAL_TABLET | Freq: Two times a day (BID) | ORAL | Status: DC

## 2014-04-07 MED ORDER — MELOXICAM 15 MG PO TABS: 15.0000 mg | ORAL_TABLET | Freq: Every day | ORAL | Status: DC

## 2014-04-07 MED ORDER — CYCLOBENZAPRINE HCL 10 MG PO TABS: 10.0000 mg | ORAL_TABLET | Freq: Three times a day (TID) | ORAL | Status: DC | PRN

## 2014-04-07 NOTE — Patient Instructions (Signed)
Increase your blood pressure medication to 2 tabs once a day.  When we recheck in 48 hrs if we are going to leave you on this dose, then I will write you a new prescription.  I want you to apply heat (warm compresses) to your neck for 20 minutes 3-4x/d followed by gentle stretching. RTC immed if worsening or you develop any other symptoms.  When you are on meloxicam, do not use with any other otc pain medication other than tylenol/acetaminophen - so no aleve, ibuprofen, motrin, advil, etc.  Occipital Neuralgia Occipital neuralgia is a type of headache that causes episodes of very bad pain in the back of your head. Pain from occipital neuralgia may spread (radiate) to other parts of your head. The pain is usually brief and often goes away after you rest and relax. These headaches may be caused by irritation of the nerves that leave your spinal cord high up in your neck, just below the base of your skull (occipital nerves). Your occipital nerves transmit sensations from the back of your head, the top of your head, and the areas behind your ears. CAUSES Occipital neuralgia can occur without any known cause (primary headache syndrome). In other cases, occipital neuralgia is caused by pressure on or irritation of one of the two occipital nerves. Causes of occipital nerve compression or irritation include:  Wear and tear of the vertebrae in the neck (osteoarthritis).  Neck injury.  Disease of the disks that separate the vertebrae.  Tumors.  Gout.  Infections.  Diabetes.  Swollen blood vessels that put pressure on the occipital nerves.  Muscle spasm in the neck. SIGNS AND SYMPTOMS Pain is the main symptom of occipital neuralgia. It usually starts in the back of the head but may also be felt in other areas supplied by the occipital nerves. Pain is usually on one side but may be on both sides. You may have:   Brief episodes of very bad pain that is burning, stabbing, shocking, or  shooting.  Pain behind the eye.  Pain triggered by neck movement or hair brushing.  Scalp tenderness.  Aching in the back of the head between episodes of very bad pain. DIAGNOSIS  Your health care provider may diagnose occipital neuralgia based on your symptoms and a physical exam. During the exam, the health care provider may push on areas supplied by the occipital nerves to see if they are painful. Some tests may also be done to help in making the diagnosis. These may include:  Imaging studies of the upper spinal cord, such as an MRI or CT scan. These may show compression or spinal cord abnormalities.  Nerve block. You will get an injection of numbing medicine (local anesthetic) near the occipital nerve to see if this relieves pain. TREATMENT  Treatment may begin with simple measures, such as:   Rest.  Massage.  Heat.  Over-the-counter pain relievers. If these measures do not work, you may need other treatments, including:  Medicines such as:  Prescription-strength anti-inflammatory medicines.  Muscle relaxants.  Antiseizure medicines.  Antidepressants.  Steroid injection. This involves injections of local anesthetic and strong anti-inflammatory drugs (steroids).  Pulsed radiofrequency. Wires are implanted to deliver electrical impulses that block pain signals from the occipital nerve.  Physical therapy.  Surgery to relieve nerve pressure. HOME CARE INSTRUCTIONS  Take all medicines as directed by your health care provider.  Avoid activities that cause pain.  Rest when you have an attack of pain.  Try gentle massage or  a heating pad to relieve pain.  Work with a physical therapist to learn stretching exercises you can do at home.  Try a different pillow or sleeping position.  Practice good posture.  Try to stay active. Get regular exercise that does not cause pain. Ask your health care provider to suggest safe exercises for you.  Keep all follow-up  visits as directed by your health care provider. This is important. SEEK MEDICAL CARE IF:  Your medicine is not working.  You have new or worsening symptoms. SEEK IMMEDIATE MEDICAL CARE IF:  You have very bad head pain that is not going away.  You have a sudden change in vision, balance, or speech. MAKE SURE YOU:  Understand these instructions.  Will watch your condition.  Will get help right away if you are not doing well or get worse. Document Released: 02/05/2001 Document Revised: 06/28/2013 Document Reviewed: 02/03/2013 [facility] Patient Information 2015 [employer], [place]. This information is not intended to replace advice given to you by your health care provider. Make sure you discuss any questions you have with your health care provider. Lymphadenopathy Lymphadenopathy means "disease of the lymph glands." But the term is usually used to describe swollen or enlarged lymph glands, also called lymph nodes. These are the bean-shaped organs found in many locations including the neck, underarm, and groin. Lymph glands are part of the immune system, which fights infections in your body. Lymphadenopathy can occur in just one area of the body, such as the neck, or it can be generalized, with lymph node enlargement in several areas. The nodes found in the neck are the most common sites of lymphadenopathy. CAUSES When your immune system responds to germs (such as viruses or bacteria ), infection-fighting cells and fluid build up. This causes the glands to grow in size. Usually, this is not something to worry about. Sometimes, the glands themselves can become infected and inflamed. This is called lymphadenitis. Enlarged lymph nodes can be caused by many diseases:  Bacterial disease, such as strep throat or a skin infection.  Viral disease, such as a common cold.  Other germs, such as Lyme disease, tuberculosis, or sexually transmitted diseases.  Cancers, such as lymphoma (cancer of the  lymphatic system) or leukemia (cancer of the white blood cells).  Inflammatory diseases such as lupus or rheumatoid arthritis.  Reactions to medications. Many of the diseases above are rare, but important. This is why you should see your caregiver if you have lymphadenopathy. SYMPTOMS  Swollen, enlarged lumps in the neck, back of the head, or other locations.  Tenderness.  Warmth or redness of the skin over the lymph nodes.  Fever. DIAGNOSIS Enlarged lymph nodes are often near the source of infection. They can help health care providers diagnose your illness. For instance:  Swollen lymph nodes around the jaw might be caused by an infection in the mouth.  Enlarged glands in the neck often signal a throat infection.  Lymph nodes that are swollen in more than one area often indicate an illness caused by a virus. Your caregiver will likely know what is causing your lymphadenopathy after listening to your history and examining you. Blood tests, x-rays, or other tests may be needed. If the cause of the enlarged lymph node cannot be found, and it does not go away by itself, then a biopsy may be needed. Your caregiver will discuss this with you. TREATMENT Treatment for your enlarged lymph nodes will depend on the cause. Many times the nodes will shrink to  normal size by themselves, with no treatment. Antibiotics or other medicines may be needed for infection. Only take over-the-counter or prescription medicines for pain, discomfort, or fever as directed by your caregiver. HOME CARE INSTRUCTIONS Swollen lymph glands usually return to normal when the underlying medical condition goes away. If they persist, contact your health-care provider. He/she might prescribe antibiotics or other treatments, depending on the diagnosis. Take any medications exactly as prescribed. Keep any follow-up appointments made to check on the condition of your enlarged nodes. SEEK MEDICAL CARE IF:  Swelling lasts for  more than two weeks.  You have symptoms such as weight loss, night sweats, fatigue, or fever that does not go away.  The lymph nodes are hard, seem fixed to the skin, or are growing rapidly.  Skin over the lymph nodes is red and inflamed. This could mean there is an infection. SEEK IMMEDIATE MEDICAL CARE IF:  Fluid starts leaking from the area of the enlarged lymph node.  You develop a fever of 102 F (38.9 C) or greater.  Severe pain develops (not necessarily at the site of a large lymph node).  You develop chest pain or shortness of breath.  You develop worsening abdominal pain. MAKE SURE YOU:  Understand these instructions.  Will watch your condition.  Will get help right away if you are not doing well or get worse. Document Released: 11/21/2007 Document Revised: 06/28/2013 Document Reviewed: 11/21/2007 [facility] Patient Information 2015 [place], [place]. This information is not intended to replace advice given to you by your health care provider. Make sure you discuss any questions you have with your health care provider.

## 2014-04-07 NOTE — Progress Notes (Addendum)
Subjective:  This chart was scribed for Norberto SorensonEva Rashawna Scoles, Briggs by Charline BillsEssence Howell, ED Scribe. The patient was seen in room 11. Patient's care was started at 1:52 PM.   Patient ID: Tammy Briggs, female    DOB: Oct 02, 1970, 44 y.o.   MRN: [id number]  Chief Complaint  Patient presents with  . Neck Pain    X1week/ worse last night/felt like would pass out last night  . Up into the head also   HPI HPI Comments: Tammy Briggs is a 44 y.o. female, with a h/o HTN, muscle spasms, who presents to the [facility]. Pt has been dealing with chronic pain including lumbar disc surgery 6 months ago by Tammy Briggs at L5-S1. Pt's BP is elevated; similarly elevated when she was seen with low back pain this summer. Pt's weight is unchanged.   Today, pt presents with sudden onset of intermittent neck pain onset 1 week ago, worsened since last night. She describes pain as pressure that radiates into her head. She describes HA as a burning sensation. Pt reports associated neck swelling, nausea, hot flashes, 2 episodes of light-headedness, mild SOB, tremors in lower extremities onset last night. She denies chest pain, visual disturbances. Pt reports similar symptoms in the past that she attributed to a panic attack. Pt reports mild anxiety last night as well. She denies recent bug bites or h/o shingles. No h/o neck pain. Pt has been treating with cold wash clothes, eating ice, ibuprofen and Goody's powder that she typically takes for knee pain. Pt reports taking 4-8 ibuprofen and 1-2 goody powders almost daily.  Hypertension Pt does not check BP at home. She is currently taking Lisinopril but states that she does not think her BP medication is the proper dose.   PCP: Tammy Briggs  Past Medical History  Diagnosis Date  . Hypertension     takes Tammy Briggs daily  . GERD (gastroesophageal reflux disease)     takes Nexium daily  . Muscle spasm     takes Flexeril daily as needed    . PONV (postoperative nausea and vomiting)   . Pneumonia hx of-6244yr ago  . History of bronchitis 3-5451yrs ago  . Weakness     numbness and tingling left leg  . Joint swelling     right   . Constipation   . History of kidney stones    Current Outpatient Prescriptions on File Prior to Visit  Medication Sig Dispense Refill  . esomeprazole (NEXIUM) 20 MG capsule Take 20 mg by mouth daily at 12 noon.    Marland Kitchen. lisinopril-hydrochlorothiazide (PRINZIDE,ZESTORETIC) 10-12.5 MG per tablet Take 1 tablet by mouth daily.    Marland Kitchen. lisinopril-hydrochlorothiazide (PRINZIDE,ZESTORETIC) 10-12.5 MG per tablet TAKE 1 TABLET BY MOUTH DAILY. 30 tablet 3  . vitamin C (ASCORBIC ACID) 500 MG tablet Take 1,000 mg by mouth daily.     No current facility-administered medications on file prior to visit.   No Known Allergies  Review of Systems  Eyes: Negative for visual disturbance.  Respiratory: Positive for shortness of breath.   Cardiovascular: Negative for chest pain.  Gastrointestinal: Positive for nausea.  Musculoskeletal: Positive for neck pain.  Skin: Negative for rash.  Neurological: Positive for tremors, light-headedness and headaches.   BP 180/108 mmHg  Pulse 97  Temp(Src) 98.1 F (36.7 C) (Oral)  Resp 16  Ht 5\' 6"  (1.676 m)  Wt 320 lb 12.8 oz (145.514 kg)  BMI 51.80 kg/m2  SpO2 99%  LMP 03/09/2014  Objective:   Physical Exam  Constitutional: She is oriented to person, place, and time. She appears well-developed and well-nourished. No distress.  HENT:  Head: Normocephalic and atraumatic.  Right Ear: Tympanic membrane normal.  Left Ear: Tympanic membrane normal.  Nose: Nose normal.  Mouth/Throat: Uvula is midline.  Bilateral TMs: scarring   Eyes: Conjunctivae and EOM are normal.  Neck: Normal range of motion. Neck supple. Spinous process tenderness present.  No tenderness over the occipital groove. Cervical spinous process tenderness to palpation. Negaive Spurling's.   Cardiovascular:  Normal rate.   Pulmonary/Chest: Effort normal.  Musculoskeletal: Normal range of motion.  Lymphadenopathy:    She has no cervical adenopathy.       Right cervical: No posterior cervical adenopathy present. Neurological: She is alert and oriented to person, place, and time.  Skin: Skin is warm and dry. Rash noted.  Blanching serpiginous, erythematous rash over nape of neck.   Psychiatric: She has a normal mood and affect. Her behavior is normal.  Nursing note and vitals reviewed.   EKG: Normal sinus rhythm. No ischemic changes.     Results for orders placed or performed in visit on 04/07/14  POCT CBC  Result Value Ref Range   WBC 11.4 (A) 4.6 - 10.2 K/uL   Lymph, poc 1.5 0.6 - 3.4   POC LYMPH PERCENT 12.8 10 - 50 %L   MID (cbc) 0.6 0 - 0.9   POC MID % 5.3 0 - 12 %M   POC Granulocyte 9.3 (A) 2 - 6.9   Granulocyte percent 81.9 (A) 37 - 80 %G   RBC 4.66 4.04 - 5.48 M/uL   Hemoglobin 12.1 (A) 12.2 - 16.2 g/dL   HCT, POC 65.7 84.6 - 47.9 %   MCV 81.1 80 - 97 fL   MCH, POC 25.9 (A) 27 - 31.2 pg   MCHC 32.0 31.8 - 35.4 g/dL   RDW, POC 96.2 %   Platelet Count, POC 335 142 - 424 K/uL   MPV 7.8 0 - 99.8 fL  POCT glucose (manual entry)  Result Value Ref Range   POC Glucose 102 (A) 70 - 99 mg/dl  POCT SEDIMENTATION RATE  Result Value Ref Range   POCT SED RATE 47 (A) 0 - 22 mm/hr    Assessment & Plan:   Headache, unspecified headache type - Plan: POCT CBC, POCT glucose (manual entry), POCT SEDIMENTATION RATE - suspect due to sinus infection but sxs are atypical so recheck in 48 hrs - sooner if worseing. If not sig improvement or localizations of sxs at that time then will cons imagine - head CT and c-spine CT poss?  Shortness of breath - Plan: POCT CBC, POCT SEDIMENTATION RATE, Comprehensive metabolic panel, EKG 12-Lead  Essential hypertension - Plan: Comprehensive metabolic panel, EKG 12-Lead - BP likely elev secondary to pain/illness so rec increase Tammy Briggs 10-12.5 qd to  bid and recheck in 2d.   Cervical paraspinal muscle spasm - try top heat, gentle stretching.  F/u immed if worsening. Start trial of meloxicam - Do not use with any other otc pain medication other than tylenol/acetaminophen - so no aleve, ibuprofen, motrin, advil, etc.  Occipital neuralgia of left side  Medication monitoring encounter  Arthritis of knee  Dental abscess  Meds ordered this encounter  Medications  . meloxicam (MOBIC) 15 MG tablet    Sig: Take 1 tablet (15 mg total) by mouth daily.    Dispense:  30 tablet    Refill:  1  .  amoxicillin-clavulanate (AUGMENTIN) 875-125 MG per tablet    Sig: Take 1 tablet by mouth 2 (two) times daily.    Dispense:  28 tablet    Refill:  0  . cyclobenzaprine (FLEXERIL) 10 MG tablet    Sig: Take 1 tablet (10 mg total) by mouth 3 (three) times daily as needed for muscle spasms.    Dispense:  30 tablet    Refill:  0    Over 40 min spent in face-to-face evaluation of and consultation with patient and coordination of care.  Over 50% of this time was spent counseling this patient.  I personally performed the services described in this documentation, which was scribed in my presence. The recorded information has been reviewed and considered, and addended by me as needed.  Norberto Sorenson, Briggs MPH

## 2014-04-09 ENCOUNTER — Ambulatory Visit (INDEPENDENT_AMBULATORY_CARE_PROVIDER_SITE_OTHER): Payer: BLUE CROSS/BLUE SHIELD | Admitting: Family Medicine

## 2014-04-09 VITALS — BP 140/86 | HR 95 | Temp 97.6°F | Ht 66.0 in | Wt 317.4 lb

## 2014-04-09 DIAGNOSIS — I10 Essential (primary) hypertension: Secondary | ICD-10-CM

## 2014-04-09 DIAGNOSIS — Z5189 Encounter for other specified aftercare: Secondary | ICD-10-CM | POA: Diagnosis not present

## 2014-04-09 DIAGNOSIS — M542 Cervicalgia: Secondary | ICD-10-CM | POA: Diagnosis not present

## 2014-04-09 DIAGNOSIS — R51 Headache: Secondary | ICD-10-CM

## 2014-04-09 DIAGNOSIS — Z5181 Encounter for therapeutic drug level monitoring: Secondary | ICD-10-CM

## 2014-04-09 DIAGNOSIS — R519 Headache, unspecified: Secondary | ICD-10-CM

## 2014-04-09 LAB — BASIC METABOLIC PANEL
BUN: 10 mg/dL (ref 6–23)
CALCIUM: 9.2 mg/dL (ref 8.4–10.5)
CO2: 29 meq/L (ref 19–32)
CREATININE: 0.66 mg/dL (ref 0.50–1.10)
Chloride: 98 mEq/L (ref 96–112)
GLUCOSE: 96 mg/dL (ref 70–99)
Potassium: 3.5 mEq/L (ref 3.5–5.3)
SODIUM: 137 meq/L (ref 135–145)

## 2014-04-09 LAB — POCT CBC
Granulocyte percent: 75.2 %G (ref 37–80)
HEMATOCRIT: 38.9 % (ref 37.7–47.9)
Hemoglobin: 12.1 g/dL — AB (ref 12.2–16.2)
Lymph, poc: 1.7 (ref 0.6–3.4)
MCH, POC: 25.6 pg — AB (ref 27–31.2)
MCHC: 31.2 g/dL — AB (ref 31.8–35.4)
MCV: 82.1 fL (ref 80–97)
MID (cbc): 0.4 (ref 0–0.9)
MPV: 7.5 fL (ref 0–99.8)
PLATELET COUNT, POC: 331 10*3/uL (ref 142–424)
POC Granulocyte: 6.2 (ref 2–6.9)
POC LYMPH %: 20.3 % (ref 10–50)
POC MID %: 4.5 %M (ref 0–12)
RBC: 4.74 M/uL (ref 4.04–5.48)
RDW, POC: 15.7 %
WBC: 8.2 10*3/uL (ref 4.6–10.2)

## 2014-04-09 LAB — POCT SEDIMENTATION RATE: POCT SED RATE: 37 mm/hr — AB (ref 0–22)

## 2014-04-09 MED ORDER — LISINOPRIL-HYDROCHLOROTHIAZIDE 20-25 MG PO TABS: 1.0000 | ORAL_TABLET | Freq: Every day | ORAL | Status: DC

## 2014-04-09 NOTE — Progress Notes (Signed)
Subjective:  This chart was scribed for Norberto Sorenson, MD, by Lionel December, ED Scribe. This patient was seen in room 1 and the patient's care was started at 1:21 PM.    Patient ID: Tammy Briggs, female    DOB: 27-Sep-1970, 44 y.o.   MRN: [id number] Chief Complaint  Patient presents with  . Follow-up    Review Labs    HPI  HPI Comments: Tammy Briggs is a 44 y.o. female who presents to [facility] for a follow up to review labs. Notes her headache is gone and her neck pain is intermittent and not as constant.  Patient denies dizziness or lightheadedness, sinus pain/ pressure, fever and chills.      Past Medical History  Diagnosis Date  . Hypertension     takes Lisinopril-HCTZ daily  . GERD (gastroesophageal reflux disease)     takes Nexium daily  . Muscle spasm     takes Flexeril daily as needed  . PONV (postoperative nausea and vomiting)   . Pneumonia hx of-55yr ago  . History of bronchitis 3-51yrs ago  . Weakness     numbness and tingling left leg  . Joint swelling     right   . Constipation   . History of kidney stones     Current Outpatient Prescriptions on File Prior to Visit  Medication Sig Dispense Refill  . amoxicillin-clavulanate (AUGMENTIN) 875-125 MG per tablet Take 1 tablet by mouth 2 (two) times daily. 28 tablet 0  . cyclobenzaprine (FLEXERIL) 10 MG tablet Take 1 tablet (10 mg total) by mouth 3 (three) times daily as needed for muscle spasms. 30 tablet 0  . esomeprazole (NEXIUM) 20 MG capsule Take 20 mg by mouth daily at 12 noon.    Marland Kitchen lisinopril-hydrochlorothiazide (PRINZIDE,ZESTORETIC) 10-12.5 MG per tablet TAKE 1 TABLET BY MOUTH DAILY. 30 tablet 3  . meloxicam (MOBIC) 15 MG tablet Take 1 tablet (15 mg total) by mouth daily. 30 tablet 1  . vitamin C (ASCORBIC ACID) 500 MG tablet Take 1,000 mg by mouth daily.     No current facility-administered medications on file prior to visit.    No Known Allergies    Review of Systems    Constitutional: Negative for fever and chills.  HENT: Negative for sinus pressure.   Musculoskeletal: Positive for neck pain.  Neurological: Negative for dizziness, light-headedness and headaches.     BP 140/86 mmHg  Pulse 95  Temp(Src) 97.6 F (36.4 C) (Oral)  Ht  (1.676 m)  Wt 317 lb 6.4 oz (143.972 kg)  BMI 51.25 kg/m2  SpO2 96%  LMP 04/09/2014     Objective:   Physical Exam  Constitutional: She is oriented to person, place, and time. She appears well-developed and well-nourished. No distress.  HENT:  Head: Normocephalic and atraumatic.  Eyes: Conjunctivae and EOM are normal.  Neck: Neck supple. No tracheal deviation present.  Cardiovascular: Normal rate.   Pulmonary/Chest: Effort normal. No respiratory distress.  Musculoskeletal: Normal range of motion.  Neurological: She is alert and oriented to person, place, and time.  Skin: Skin is warm and dry.  Psychiatric: She has a normal mood and affect. Her behavior is normal.  Nursing note and vitals reviewed.    Results for orders placed or performed in visit on 04/07/14  Comprehensive metabolic panel  Result Value Ref Range   Sodium 139 135 - 145 mEq/L   Potassium 3.5 3.5 - 5.3 mEq/L   Chloride 100 96 -  112 mEq/L   CO2 28 19 - 32 mEq/L   Glucose, Bld 88 70 - 99 mg/dL   BUN 16 6 - 23 mg/dL   Creat 9.600.65 4.540.50 - 0.981.10 mg/dL   Total Bilirubin 0.3 0.2 - 1.2 mg/dL   Alkaline Phosphatase 113 39 - 117 U/L   AST 14 0 - 37 U/L   ALT 13 0 - 35 U/L   Total Protein 6.2 6.0 - 8.3 g/dL   Albumin 3.7 3.5 - 5.2 g/dL   Calcium 8.9 8.4 - 11.910.5 mg/dL  POCT CBC  Result Value Ref Range   WBC 11.4 (A) 4.6 - 10.2 K/uL   Lymph, poc 1.5 0.6 - 3.4   POC LYMPH PERCENT 12.8 10 - 50 %L   MID (cbc) 0.6 0 - 0.9   POC MID % 5.3 0 - 12 %M   POC Granulocyte 9.3 (A) 2 - 6.9   Granulocyte percent 81.9 (A) 37 - 80 %G   RBC 4.66 4.04 - 5.48 M/uL   Hemoglobin 12.1 (A) 12.2 - 16.2 g/dL   HCT, POC 14.737.8 82.937.7 - 47.9 %   MCV 81.1 80 - 97 fL    MCH, POC 25.9 (A) 27 - 31.2 pg   MCHC 32.0 31.8 - 35.4 g/dL   RDW, POC 56.215.1 %   Platelet Count, POC 335 142 - 424 K/uL   MPV 7.8 0 - 99.8 fL  POCT glucose (manual entry)  Result Value Ref Range   POC Glucose 102 (A) 70 - 99 mg/dl  POCT SEDIMENTATION RATE  Result Value Ref Range   POCT SED RATE 47 (A) 0 - 22 mm/hr        Assessment & Plan:   Neck pain on left side - Plan: POCT CBC, POCT SEDIMENTATION RATE - improving - cont watchful waiting. Heat, gentle stretch  Acute nonintractable headache, unspecified headache type - Plan: POCT CBC, POCT SEDIMENTATION RATE  Essential hypertension, benign - Plan: Basic metabolic panel  Encounter for medication titration - Plan: Basic metabolic panel  Meds ordered this encounter  Medications  . lisinopril-hydrochlorothiazide (PRINZIDE,ZESTORETIC) 20-25 MG per tablet    Sig: Take 1 tablet by mouth daily.    Dispense:  90 tablet    Refill:  1    I personally performed the services described in this documentation, which was scribed in my presence. The recorded information has been reviewed and considered, and addended by me as needed.  Norberto SorensonEva Indigo Chaddock, MD MPH

## 2014-05-15 ENCOUNTER — Encounter: Payer: Self-pay | Admitting: Family Medicine

## 2014-06-04 ENCOUNTER — Other Ambulatory Visit: Payer: Self-pay | Admitting: Family Medicine

## 2014-10-04 ENCOUNTER — Other Ambulatory Visit: Payer: Self-pay | Admitting: Family Medicine

## 2015-01-03 ENCOUNTER — Other Ambulatory Visit: Payer: Self-pay | Admitting: Family Medicine

## 2015-01-06 ENCOUNTER — Other Ambulatory Visit: Payer: Self-pay | Admitting: Family Medicine

## 2015-01-23 ENCOUNTER — Encounter: Payer: Self-pay | Admitting: Internal Medicine

## 2015-02-01 ENCOUNTER — Other Ambulatory Visit: Payer: Self-pay | Admitting: Family Medicine

## 2015-02-19 ENCOUNTER — Other Ambulatory Visit: Payer: Self-pay | Admitting: Internal Medicine

## 2015-04-18 ENCOUNTER — Other Ambulatory Visit: Payer: Self-pay | Admitting: Family Medicine

## 2015-05-21 ENCOUNTER — Other Ambulatory Visit: Payer: Self-pay | Admitting: Family Medicine

## 2015-05-26 IMAGING — CR DG CHEST 2V
2 series · 2 of 2 positions shown · non-contrast
Comparison: Report of CT chest 12/22/2010.

CLINICAL DATA: Preoperative chest radiograph. No current chest
complaints.

EXAM:
CHEST  2 VIEW

[w chest pa]
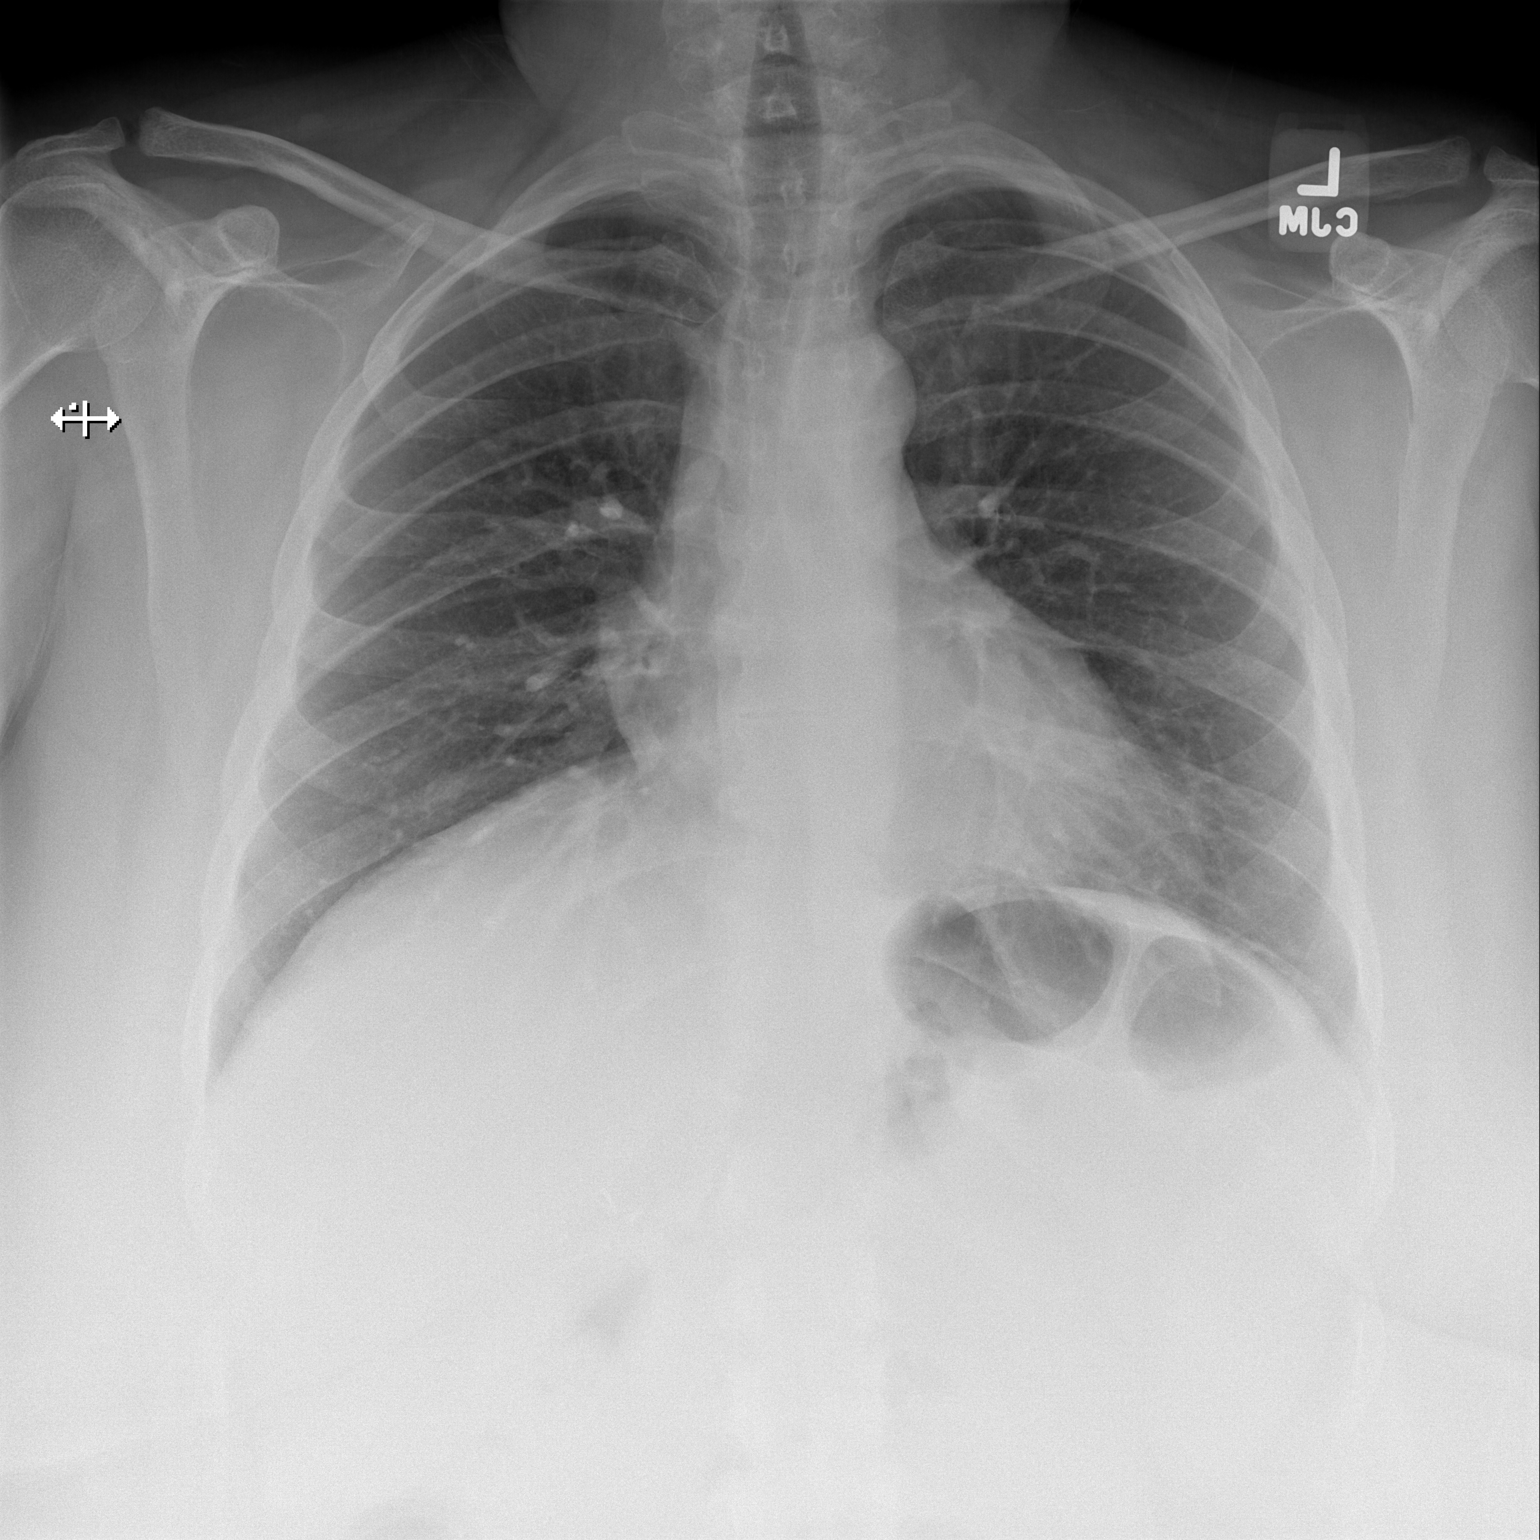

[w chest lat]
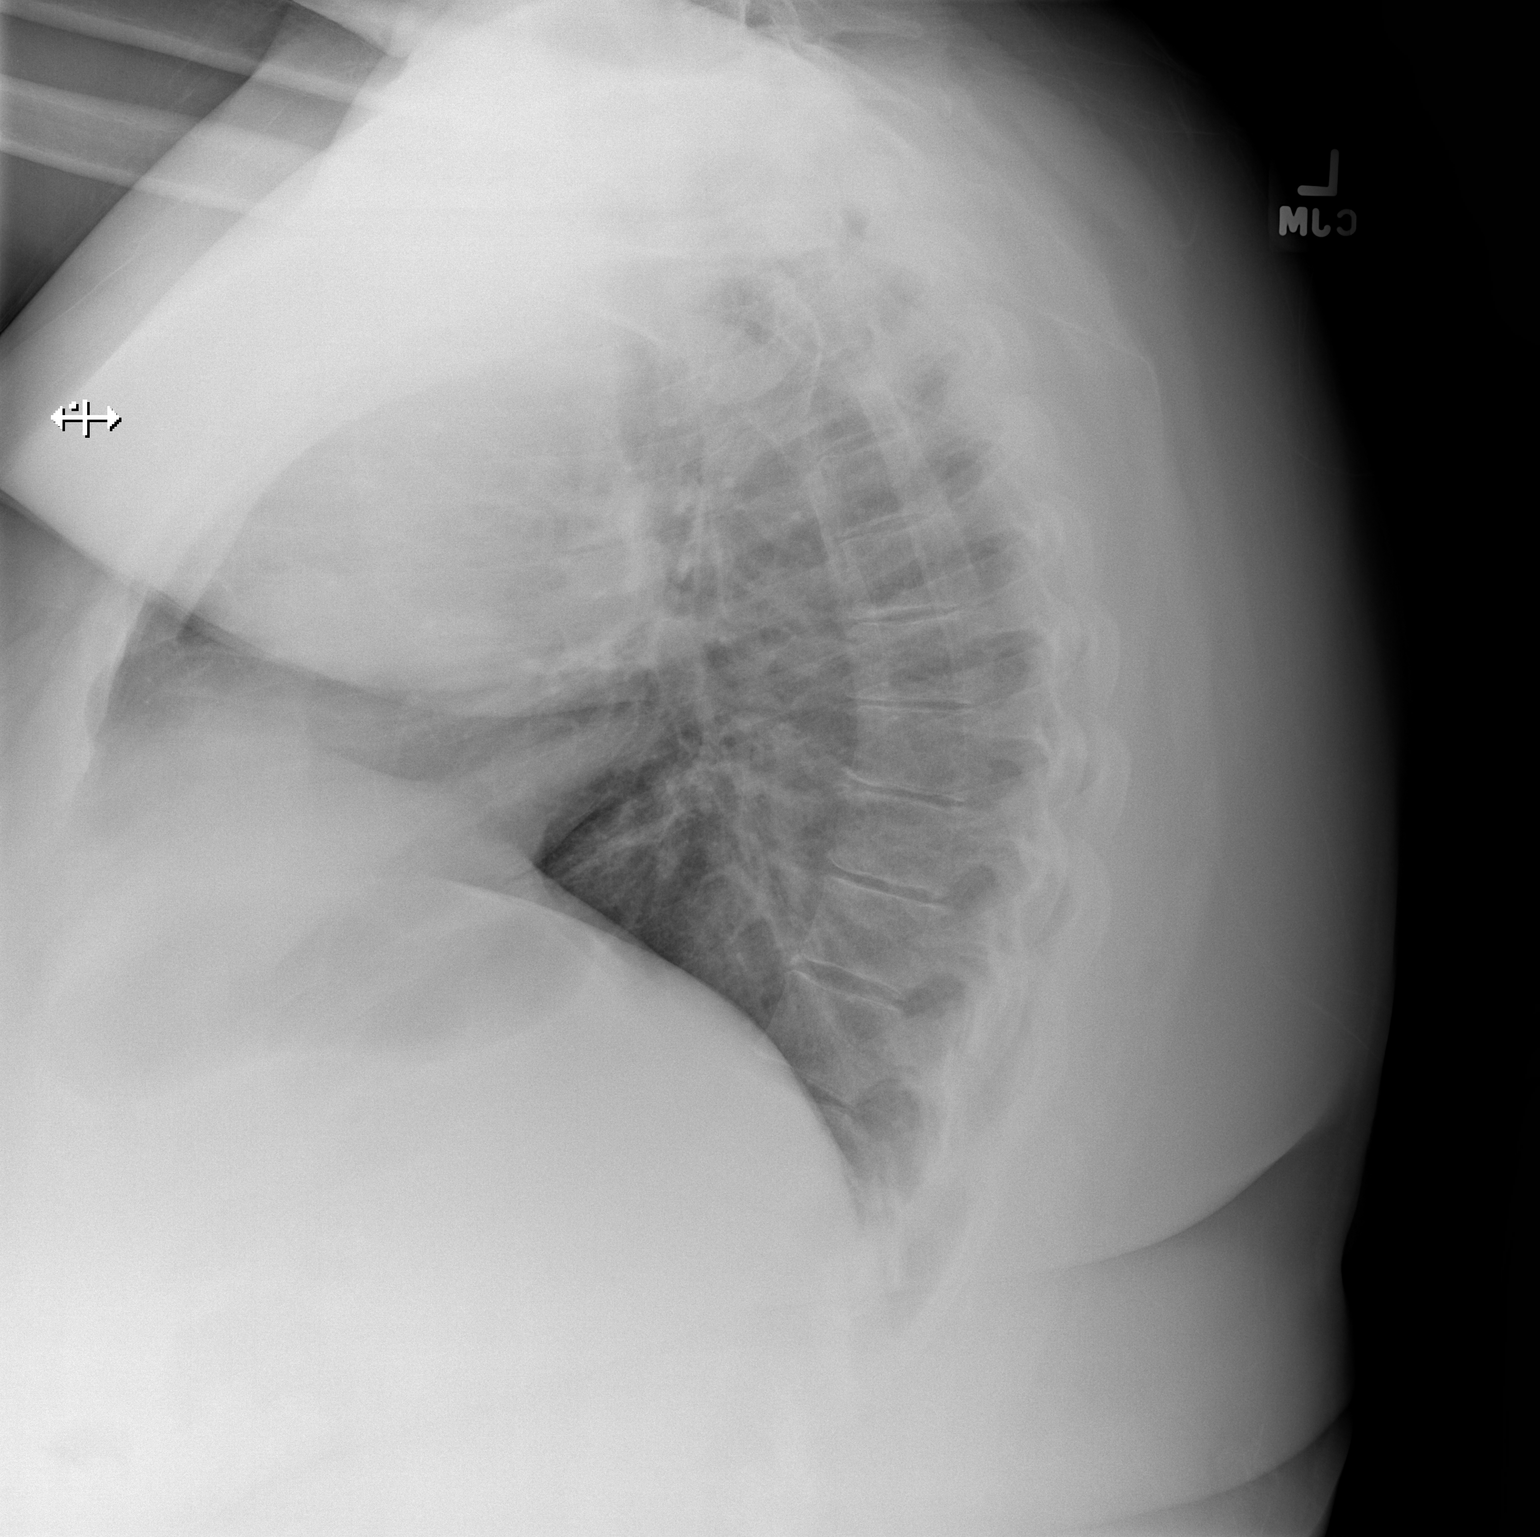

[2 of 2 positions shown; findings below may reference images not displayed]

FINDINGS: The heart size and mediastinal contours are within normal limits.
Both lungs are clear. The visualized skeletal structures are
unremarkable.
IMPRESSION: No active cardiopulmonary disease.

## 2015-06-10 ENCOUNTER — Ambulatory Visit (INDEPENDENT_AMBULATORY_CARE_PROVIDER_SITE_OTHER): Payer: Managed Care, Other (non HMO) | Admitting: Family Medicine

## 2015-06-10 VITALS — BP 120/90 | HR 86 | Temp 97.8°F | Resp 18 | Ht 66.25 in | Wt 327.0 lb

## 2015-06-10 DIAGNOSIS — I10 Essential (primary) hypertension: Secondary | ICD-10-CM | POA: Diagnosis not present

## 2015-06-10 DIAGNOSIS — M545 Low back pain, unspecified: Secondary | ICD-10-CM | POA: Insufficient documentation

## 2015-06-10 MED ORDER — MELOXICAM 7.5 MG PO TABS: 7.5000 mg | ORAL_TABLET | Freq: Every day | ORAL | Status: DC

## 2015-06-10 MED ORDER — LISINOPRIL-HYDROCHLOROTHIAZIDE 20-25 MG PO TABS: ORAL_TABLET | ORAL | Status: DC

## 2015-06-10 NOTE — Patient Instructions (Addendum)
Hypertension Hypertension, commonly called high blood pressure, is when the force of blood pumping through your arteries is too strong. Your arteries are the blood vessels that carry blood from your heart throughout your body. A blood pressure reading consists of a higher number over a lower number, such as 110/72. The higher number (systolic) is the pressure inside your arteries when your heart pumps. The lower number (diastolic) is the pressure inside your arteries when your heart relaxes. Ideally you want your blood pressure below 120/80. Hypertension forces your heart to work harder to pump blood. Your arteries may become narrow or stiff. Having untreated or uncontrolled hypertension can cause heart attack, stroke, kidney disease, and other problems. RISK FACTORS Some risk factors for high blood pressure are controllable. Others are not.  Risk factors you cannot control include:   Race. You may be at higher risk if you are African American.  Age. Risk increases with age.  Gender. Men are at higher risk than women before age 45 years. After age 65, women are at higher risk than men. Risk factors you can control include:  Not getting enough exercise or physical activity.  Being overweight.  Getting too much fat, sugar, calories, or salt in your diet.  Drinking too much alcohol. SIGNS AND SYMPTOMS Hypertension does not usually cause signs or symptoms. Extremely high blood pressure (hypertensive crisis) may cause headache, anxiety, shortness of breath, and nosebleed. DIAGNOSIS To check if you have hypertension, your health care provider will measure your blood pressure while you are seated, with your arm held at the level of your heart. It should be measured at least twice using the same arm. Certain conditions can cause a difference in blood pressure between your right and left arms. A blood pressure reading that is higher than normal on one occasion does not mean that you need treatment. If  it is not clear whether you have high blood pressure, you may be asked to return on a different day to have your blood pressure checked again. Or, you may be asked to monitor your blood pressure at home for 1 or more weeks. TREATMENT Treating high blood pressure includes making lifestyle changes and possibly taking medicine. Living a healthy lifestyle can help lower high blood pressure. You may need to change some of your habits. Lifestyle changes may include:  Following the DASH diet. This diet is high in fruits, vegetables, and whole grains. It is low in salt, red meat, and added sugars.  Keep your sodium intake below 2,300 mg per day.  Getting at least 30-45 minutes of aerobic exercise at least 4 times per week.  Losing weight if necessary.  Not smoking.  Limiting alcoholic beverages.  Learning ways to reduce stress. Your health care provider may prescribe medicine if lifestyle changes are not enough to get your blood pressure under control, and if one of the following is true:  You are 18-59 years of age and your systolic blood pressure is above 140.  You are 60 years of age or older, and your systolic blood pressure is above 150.  Your diastolic blood pressure is above 90.  You have diabetes, and your systolic blood pressure is over 140 or your diastolic blood pressure is over 90.  You have kidney disease and your blood pressure is above 140/90.  You have heart disease and your blood pressure is above 140/90. Your personal target blood pressure may vary depending on your medical conditions, your age, and other factors. HOME CARE INSTRUCTIONS    Have your blood pressure rechecked as directed by your health care provider.   Take medicines only as directed by your health care provider. Follow the directions carefully. Blood pressure medicines must be taken as prescribed. The medicine does not work as well when you skip doses. Skipping doses also puts you at risk for  problems.  Do not smoke.   Monitor your blood pressure at home as directed by your health care provider. SEEK MEDICAL CARE IF:   You think you are having a reaction to medicines taken.  You have recurrent headaches or feel dizzy.  You have swelling in your ankles.  You have trouble with your vision. SEEK IMMEDIATE MEDICAL CARE IF:  You develop a severe headache or confusion.  You have unusual weakness, numbness, or feel faint.  You have severe chest or abdominal pain.  You vomit repeatedly.  You have trouble breathing. MAKE SURE YOU:   Understand these instructions.  Will watch your condition.  Will get help right away if you are not doing well or get worse.   This information is not intended to replace advice given to you by your health care provider. Make sure you discuss any questions you have with your health care provider.   Document Released: 02/11/2005 Document Revised: 06/28/2014 Document Reviewed: 12/04/2012 Elsevier Interactive Patient Education 2016 Elsevier Inc.  

## 2015-06-10 NOTE — Progress Notes (Signed)
Is a 45 year old woman comes in to have her blood pressure medicine refilled. She also has chronic back pain and would like to have something done about this.  Patient's had a past history of lumbar laminectomy and does fairly well with this. She has been treating her back with Tylenol in the morning and Aleve in the afternoon since she ran out of her meloxicam.  Objective. BP 120/90 mmHg  Pulse 86  Temp(Src) 97.8 F (36.6 C) (Oral)  Resp 18  Ht 5' 6.25" (1.683 m)  Wt 327 lb (148.326 kg)  BMI 52.37 kg/m2  SpO2 98%  LMP 06/05/2015 (Exact Date) Chest: Clear Heart: Regular no murmur Assessment: Patient has morbid obesity and is taking medicines that probably contributed to her blood pressure elevation. I'm glad to hear that she is eliminating sodas from the household and is working on shedding some weight. I encouraged her to continue this.  Plan:Essential hypertension - Plan: lisinopril-hydrochlorothiazide (PRINZIDE,ZESTORETIC) 20-25 MG tablet  Morbid obesity, unspecified obesity type (HCC)  Midline low back pain without sciatica - Plan: meloxicam (MOBIC) 7.5 MG tablet  Elvina SidleKurt Jamian Andujo M.D.

## 2016-05-25 ENCOUNTER — Other Ambulatory Visit: Payer: Self-pay | Admitting: Family Medicine

## 2016-05-25 DIAGNOSIS — M545 Low back pain, unspecified: Secondary | ICD-10-CM

## 2016-05-25 DIAGNOSIS — I10 Essential (primary) hypertension: Secondary | ICD-10-CM

## 2016-06-01 ENCOUNTER — Other Ambulatory Visit: Payer: Self-pay | Admitting: Family Medicine

## 2016-06-01 DIAGNOSIS — M545 Low back pain, unspecified: Secondary | ICD-10-CM

## 2016-06-30 ENCOUNTER — Other Ambulatory Visit: Payer: Self-pay | Admitting: Physician Assistant

## 2016-06-30 DIAGNOSIS — I10 Essential (primary) hypertension: Secondary | ICD-10-CM

## 2016-07-14 ENCOUNTER — Other Ambulatory Visit: Payer: Self-pay | Admitting: Physician Assistant

## 2016-07-14 DIAGNOSIS — I10 Essential (primary) hypertension: Secondary | ICD-10-CM

## 2016-07-18 ENCOUNTER — Ambulatory Visit (INDEPENDENT_AMBULATORY_CARE_PROVIDER_SITE_OTHER): Payer: Managed Care, Other (non HMO) | Admitting: Urgent Care

## 2016-07-18 ENCOUNTER — Encounter: Payer: Self-pay | Admitting: Urgent Care

## 2016-07-18 VITALS — BP 122/88 | HR 95 | Temp 98.1°F | Resp 16 | Ht 65.75 in | Wt 319.4 lb

## 2016-07-18 DIAGNOSIS — G8929 Other chronic pain: Secondary | ICD-10-CM

## 2016-07-18 DIAGNOSIS — M545 Low back pain, unspecified: Secondary | ICD-10-CM

## 2016-07-18 DIAGNOSIS — Z9889 Other specified postprocedural states: Secondary | ICD-10-CM | POA: Diagnosis not present

## 2016-07-18 DIAGNOSIS — I10 Essential (primary) hypertension: Secondary | ICD-10-CM | POA: Diagnosis not present

## 2016-07-18 MED ORDER — LISINOPRIL-HYDROCHLOROTHIAZIDE 20-25 MG PO TABS: 1.0000 | ORAL_TABLET | Freq: Every day | ORAL | 3 refills | Status: DC

## 2016-07-18 MED ORDER — MELOXICAM 7.5 MG PO TABS: 7.5000 mg | ORAL_TABLET | Freq: Every day | ORAL | 3 refills | Status: DC

## 2016-07-18 NOTE — Patient Instructions (Addendum)
Meloxicam tablets What is this medicine? MELOXICAM (mel OX i cam) is a non-steroidal anti-inflammatory drug (NSAID). It is used to reduce swelling and to treat pain. It may be used for osteoarthritis, rheumatoid arthritis, or juvenile rheumatoid arthritis. This medicine may be used for other purposes; ask your health care provider or pharmacist if you have questions. COMMON BRAND NAME(S): Mobic What should I tell my health care provider before I take this medicine? They need to know if you have any of these conditions: -bleeding disorders -cigarette smoker -coronary artery bypass graft (CABG) surgery within the past 2 weeks -drink more than 3 alcohol-containing drinks per day -heart disease -high blood pressure -history of stomach bleeding -kidney disease -liver disease -lung or breathing disease, like asthma -stomach or intestine problems -an unusual or allergic reaction to meloxicam, aspirin, other NSAIDs, other medicines, foods, dyes, or preservatives -pregnant or trying to get pregnant -breast-feeding How should I use this medicine? Take this medicine by mouth with a full glass of water. Follow the directions on the prescription label. You can take it with or without food. If it upsets your stomach, take it with food. Take your medicine at regular intervals. Do not take it more often than directed. Do not stop taking except on your doctor's advice. A special MedGuide will be given to you by the pharmacist with each prescription and refill. Be sure to read this information carefully each time. Talk to your pediatrician regarding the use of this medicine in children. While this drug may be prescribed for selected conditions, precautions do apply. Patients over 65 years old may have a stronger reaction and need a smaller dose. Overdosage: If you think you have taken too much of this medicine contact a poison control center or emergency room at once. NOTE: This medicine is only for you. Do  not share this medicine with others. What if I miss a dose? If you miss a dose, take it as soon as you can. If it is almost time for your next dose, take only that dose. Do not take double or extra doses. What may interact with this medicine? Do not take this medicine with any of the following medications: -cidofovir -ketorolac This medicine may also interact with the following medications: -aspirin and aspirin-like medicines -certain medicines for blood pressure, heart disease, irregular heart beat -certain medicines for depression, anxiety, or psychotic disturbances -certain medicines that treat or prevent blood clots like warfarin, enoxaparin, dalteparin, apixaban, dabigatran, rivaroxaban -cyclosporine -digoxin -diuretics -methotrexate -other NSAIDs, medicines for pain and inflammation, like ibuprofen and naproxen -pemetrexed This list may not describe all possible interactions. Give your health care provider a list of all the medicines, herbs, non-prescription drugs, or dietary supplements you use. Also tell them if you smoke, drink alcohol, or use illegal drugs. Some items may interact with your medicine. What should I watch for while using this medicine? Tell your doctor or healthcare professional if your symptoms do not start to get better or if they get worse. Do not take other medicines that contain aspirin, ibuprofen, or naproxen with this medicine. Side effects such as stomach upset, nausea, or ulcers may be more likely to occur. Many medicines available without a prescription should not be taken with this medicine. This medicine can cause ulcers and bleeding in the stomach and intestines at any time during treatment. This can happen with no warning and may cause death. There is increased risk with taking this medicine for a long time. Smoking, drinking alcohol, older age,   and poor health can also increase risks. Call your doctor right away if you have stomach pain or blood in your  vomit or stool. This medicine does not prevent heart attack or stroke. In fact, this medicine may increase the chance of a heart attack or stroke. The chance may increase with longer use of this medicine and in people who have heart disease. If you take aspirin to prevent heart attack or stroke, talk with your doctor or health care professional. What side effects may I notice from receiving this medicine? Side effects that you should report to your doctor or health care professional as soon as possible: -allergic reactions like skin rash, itching or hives, swelling of the face, lips, or tongue -nausea, vomiting -signs and symptoms of a blood clot such as breathing problems; changes in vision; chest pain; severe, sudden headache; pain, swelling, warmth in the leg; trouble speaking; sudden numbness or weakness of the face, arm, or leg -signs and symptoms of bleeding such as bloody or black, tarry stools; red or dark-brown urine; spitting up blood or brown material that looks like coffee grounds; red spots on the skin; unusual bruising or bleeding from the eye, gums, or nose -signs and symptoms of liver injury like dark yellow or brown urine; general ill feeling or flu-like symptoms; light-colored stools; loss of appetite; nausea; right upper belly pain; unusually weak or tired; yellowing of the eyes or skin -signs and symptoms of stroke like changes in vision; confusion; trouble speaking or understanding; severe headaches; sudden numbness or weakness of the face, arm, or leg; trouble walking; dizziness; loss of balance or coordination Side effects that usually do not require medical attention (report to your doctor or health care professional if they continue or are bothersome): -constipation -diarrhea -gas This list may not describe all possible side effects. Call your doctor for medical advice about side effects. You may report side effects to FDA at 1-800-FDA-1088. Where should I keep my  medicine? Keep out of the reach of children. Store at room temperature between 15 and 30 degrees C (59 and 86 degrees F). Throw away any unused medicine after the expiration date. NOTE: This sheet is a summary. It may not cover all possible information. If you have questions about this medicine, talk to your doctor, pharmacist, or health care provider.  2018 Elsevier/Gold Standard (2015-03-15 19:28:16)     Hypertension Hypertension, commonly called high blood pressure, is when the force of blood pumping through the arteries is too strong. The arteries are the blood vessels that carry blood from the heart throughout the body. Hypertension forces the heart to work harder to pump blood and may cause arteries to become narrow or stiff. Having untreated or uncontrolled hypertension can cause heart attacks, strokes, kidney disease, and other problems. A blood pressure reading consists of a higher number over a lower number. Ideally, your blood pressure should be below 120/80. The first ("top") number is called the systolic pressure. It is a measure of the pressure in your arteries as your heart beats. The second ("bottom") number is called the diastolic pressure. It is a measure of the pressure in your arteries as the heart relaxes. What are the causes? The cause of this condition is not known. What increases the risk? Some risk factors for high blood pressure are under your control. Others are not. Factors you can change   Smoking.  Having type 2 diabetes mellitus, high cholesterol, or both.  Not getting enough exercise or physical activity.  Being overweight.  Having too much fat, sugar, calories, or salt (sodium) in your diet.  Drinking too much alcohol. Factors that are difficult or impossible to change   Having chronic kidney disease.  Having a family history of high blood pressure.  Age. Risk increases with age.  Race. You may be at higher risk if you are  African-American.  Gender. Men are at higher risk than women before age 46. After age 165, women are at higher risk than men.  Having obstructive sleep apnea.  Stress. What are the signs or symptoms? Extremely high blood pressure (hypertensive crisis) may cause:  Headache.  Anxiety.  Shortness of breath.  Nosebleed.  Nausea and vomiting.  Severe chest pain.  Jerky movements you cannot control (seizures). How is this diagnosed? This condition is diagnosed by measuring your blood pressure while you are seated, with your arm resting on a surface. The cuff of the blood pressure monitor will be placed directly against the skin of your upper arm at the level of your heart. It should be measured at least twice using the same arm. Certain conditions can cause a difference in blood pressure between your right and left arms. Certain factors can cause blood pressure readings to be lower or higher than normal (elevated) for a short period of time:  When your blood pressure is higher when you are in a health care provider's office than when you are at home, this is called white coat hypertension. Most people with this condition do not need medicines.  When your blood pressure is higher at home than when you are in a health care provider's office, this is called masked hypertension. Most people with this condition may need medicines to control blood pressure. If you have a high blood pressure reading during one visit or you have normal blood pressure with other risk factors:  You may be asked to return on a different day to have your blood pressure checked again.  You may be asked to monitor your blood pressure at home for 1 week or longer. If you are diagnosed with hypertension, you may have other blood or imaging tests to help your health care provider understand your overall risk for other conditions. How is this treated? This condition is treated by making healthy lifestyle changes, such as  eating healthy foods, exercising more, and reducing your alcohol intake. Your health care provider may prescribe medicine if lifestyle changes are not enough to get your blood pressure under control, and if:  Your systolic blood pressure is above 130.  Your diastolic blood pressure is above 80. Your personal target blood pressure may vary depending on your medical conditions, your age, and other factors. Follow these instructions at home: Eating and drinking   Eat a diet that is high in fiber and potassium, and low in sodium, added sugar, and fat. An example eating plan is called the DASH (Dietary Approaches to Stop Hypertension) diet. To eat this way:  Eat plenty of fresh fruits and vegetables. Try to fill half of your plate at each meal with fruits and vegetables.  Eat whole grains, such as whole wheat pasta, brown rice, or whole grain bread. Fill about one quarter of your plate with whole grains.  Eat or drink low-fat dairy products, such as skim milk or low-fat yogurt.  Avoid fatty cuts of meat, processed or cured meats, and poultry with skin. Fill about one quarter of your plate with lean proteins, such as fish, chicken without skin, beans,  eggs, and tofu.  Avoid premade and processed foods. These tend to be higher in sodium, added sugar, and fat.  Reduce your daily sodium intake. Most people with hypertension should eat less than 1,500 mg of sodium a day.  Limit alcohol intake to no more than 1 drink a day for nonpregnant women and 2 drinks a day for men. One drink equals 12 oz of beer, 5 oz of wine, or 1 oz of hard liquor. Lifestyle   Work with your health care provider to maintain a healthy body weight or to lose weight. Ask what an ideal weight is for you.  Get at least 30 minutes of exercise that causes your heart to beat faster (aerobic exercise) most days of the week. Activities may include walking, swimming, or biking.  Include exercise to strengthen your muscles  (resistance exercise), such as pilates or lifting weights, as part of your weekly exercise routine. Try to do these types of exercises for 30 minutes at least 3 days a week.  Do not use any products that contain nicotine or tobacco, such as cigarettes and e-cigarettes. If you need help quitting, ask your health care provider.  Monitor your blood pressure at home as told by your health care provider.  Keep all follow-up visits as told by your health care provider. This is important. Medicines   Take over-the-counter and prescription medicines only as told by your health care provider. Follow directions carefully. Blood pressure medicines must be taken as prescribed.  Do not skip doses of blood pressure medicine. Doing this puts you at risk for problems and can make the medicine less effective.  Ask your health care provider about side effects or reactions to medicines that you should watch for. Contact a health care provider if:  You think you are having a reaction to a medicine you are taking.  You have headaches that keep coming back (recurring).  You feel dizzy.  You have swelling in your ankles.  You have trouble with your vision. Get help right away if:  You develop a severe headache or confusion.  You have unusual weakness or numbness.  You feel faint.  You have severe pain in your chest or abdomen.  You vomit repeatedly.  You have trouble breathing. Summary  Hypertension is when the force of blood pumping through your arteries is too strong. If this condition is not controlled, it may put you at risk for serious complications.  Your personal target blood pressure may vary depending on your medical conditions, your age, and other factors. For most people, a normal blood pressure is less than 120/80.  Hypertension is treated with lifestyle changes, medicines, or a combination of both. Lifestyle changes include weight loss, eating a healthy, low-sodium diet, exercising  more, and limiting alcohol. This information is not intended to replace advice given to you by your health care provider. Make sure you discuss any questions you have with your health care provider. Document Released: 02/11/2005 Document Revised: 01/10/2016 Document Reviewed: 01/10/2016 Elsevier Interactive Patient Education  2017 [employer].     We recommend that you schedule a mammogram for breast cancer screening. Typically, you do not need a referral to do this. Please contact a local imaging center to schedule your mammogram.  [facility] - [phone]  *ask for the Radiology Department [facility]) - [phone] or [phone]  [facility] - [phone] [facility] - [phone] [facility] - [phone]  *  ask for the Radiology Department [facility] - [phone]  *ask for the Radiology Department [facility] - [phone]  *ask for the Mammography Department [facility] - [phone]    IF you received an x-ray today, you will receive an invoice from [facility]. Please contact [facility] at [phone] with questions or concerns regarding your invoice.   IF you received labwork today, you will receive an invoice from [facility]. Please contact [facility] at [phone] with questions or concerns regarding your invoice.   Our billing staff will not be able to assist you with questions regarding bills from these companies.  You will be contacted with the lab results as soon as they are available. The fastest way to get your results is to activate your My Chart account. Instructions are located on the last page of this paperwork. If you have not heard from [place] regarding the results in 2 weeks, please contact this office.

## 2016-07-18 NOTE — Progress Notes (Signed)
    MRN: [id number] DOB: [birth year]  Subjective:   Tammy Briggs is a 46 y.o. female presenting for follow up on Hypertension.   Currently managed with lis-HCTZ. Patient is not checking blood pressure at home. Avoids salt in diet, is not exercising. Reports that she has a history of back surgery (2016) and has difficulty exercising due to this. She uses meloxicam frequently for this, does not have side effects with the medication, requests refill for this as well. She was released by her back surgeon and has not been back. She was previously managed with [facility]. Denies lightheadedness, dizziness, chronic headache, blurred vision, chest pain, shortness of breath, heart racing, palpitations, nausea, vomiting, abdominal pain, hematuria, lower leg swelling. Denies smoking cigarettes. Drinks occasional alcohol beverage.   Tammy Briggs has a current medication list which includes the following prescription(s): esomeprazole, lisinopril-hydrochlorothiazide, meloxicam, and vitamin c. Also has No Known Allergies. Tammy Briggs  has a past medical history of Constipation; GERD (gastroesophageal reflux disease); History of bronchitis (3-8540yrs ago); History of kidney stones; Hypertension; Joint swelling; Muscle spasm; Pneumonia (hx of-5360yr ago); PONV (postoperative nausea and vomiting); and Weakness. Also  has a past surgical history that includes Myringotomy; adeoinectomy; HPV removed (age 89); Cholecystectomy (1071yrs ago); and Lumbar laminectomy/decompression microdiscectomy (Left, 10/05/2013).  Objective:   Vitals: BP 122/88   Pulse 95   Temp 98.1 F (36.7 C) (Oral)   Resp 16   Ht 5' 5.75" (1.67 m)   Wt (!) 319 lb 6.4 oz (144.9 kg)   LMP 07/13/2016   SpO2 98%   BMI 51.95 kg/m   Physical Exam  Constitutional: She is oriented to person, place, and time. She appears well-developed and well-nourished.  HENT:  Mouth/Throat: Oropharynx is clear and moist.  Eyes: Pupils are equal, round, and reactive  to light. No scleral icterus.  Neck: Normal range of motion. Neck supple. No thyromegaly present.  Cardiovascular: Normal rate, regular rhythm and intact distal pulses.  Exam reveals no gallop and no friction rub.   No murmur heard. Pulmonary/Chest: No respiratory distress. She has no wheezes. She has no rales.  Musculoskeletal: She exhibits edema (trace over both legs).       Back:  Neurological: She is alert and oriented to person, place, and time.  Skin: Skin is warm and dry.  Psychiatric: She has a normal mood and affect.   Assessment and Plan :   1. Essential hypertension - Stable, refilled lis-HCTZ. Labs pending. Follow up in 6 months.  2. Chronic midline low back pain without sciatica 3. History of lumbar laminectomy - Counseled patient on potential for adverse effects with medications prescribed today, she verbalized understanding. Refilled meloxicam. Recommended patient check back with [facility] for recommendations on her back surgery/pain.  Wallis BambergMario Maclain Cohron, PA-C Primary Care at [facility] [phone] 07/18/2016  5:13 PM

## 2016-07-19 LAB — COMPREHENSIVE METABOLIC PANEL
A/G RATIO: 1.5 (ref 1.2–2.2)
ALBUMIN: 3.9 g/dL (ref 3.5–5.5)
ALK PHOS: 127 IU/L — AB (ref 39–117)
ALT: 18 IU/L (ref 0–32)
AST: 19 IU/L (ref 0–40)
BUN/Creatinine Ratio: 16 (ref 9–23)
BUN: 10 mg/dL (ref 6–24)
CO2: 24 mmol/L (ref 18–29)
CREATININE: 0.61 mg/dL (ref 0.57–1.00)
Calcium: 9.1 mg/dL (ref 8.7–10.2)
Chloride: 99 mmol/L (ref 96–106)
GFR calc Af Amer: 126 mL/min/{1.73_m2} (ref >59)
GFR, EST NON AFRICAN AMERICAN: 109 mL/min/{1.73_m2} (ref >59)
GLUCOSE: 93 mg/dL (ref 65–99)
Globulin, Total: 2.6 g/dL (ref 1.5–4.5)
Potassium: 3.9 mmol/L (ref 3.5–5.2)
SODIUM: 140 mmol/L (ref 134–144)
TOTAL PROTEIN: 6.5 g/dL (ref 6.0–8.5)

## 2016-07-19 LAB — MICROALBUMIN / CREATININE URINE RATIO
Creatinine, Urine: 136.3 mg/dL
MICROALB/CREAT RATIO: 31.1 mg/g{creat} — AB (ref 0.0–30.0)
MICROALBUM., U, RANDOM: 42.4 ug/mL

## 2017-01-23 ENCOUNTER — Ambulatory Visit: Payer: Managed Care, Other (non HMO) | Admitting: Urgent Care

## 2017-07-11 ENCOUNTER — Other Ambulatory Visit: Payer: Self-pay | Admitting: Urgent Care

## 2017-07-11 DIAGNOSIS — I10 Essential (primary) hypertension: Secondary | ICD-10-CM

## 2017-07-11 NOTE — Telephone Encounter (Signed)
Lisinopril-HCTZ refill Last OV: 07/18/16 Last Refill:07/18/16 #90 3 RF [facility] [id number] PCP: Wallis Bamberg PA-C   Pt stated she had enough pill until her appt-07/14/17

## 2017-07-14 ENCOUNTER — Other Ambulatory Visit: Payer: Self-pay

## 2017-07-14 ENCOUNTER — Encounter: Payer: Self-pay | Admitting: Urgent Care

## 2017-07-14 ENCOUNTER — Ambulatory Visit: Payer: 59 | Admitting: Urgent Care

## 2017-07-14 VITALS — BP 138/80 | HR 76 | Temp 97.8°F | Resp 18 | Ht 65.75 in | Wt 259.8 lb

## 2017-07-14 DIAGNOSIS — I10 Essential (primary) hypertension: Secondary | ICD-10-CM | POA: Diagnosis not present

## 2017-07-14 MED ORDER — LISINOPRIL-HYDROCHLOROTHIAZIDE 20-25 MG PO TABS: 1.0000 | ORAL_TABLET | Freq: Every day | ORAL | 3 refills | Status: DC

## 2017-07-14 NOTE — Patient Instructions (Signed)
Hypertension Hypertension, commonly called high blood pressure, is when the force of blood pumping through the arteries is too strong. The arteries are the blood vessels that carry blood from the heart throughout the body. Hypertension forces the heart to work harder to pump blood and may cause arteries to become narrow or stiff. Having untreated or uncontrolled hypertension can cause heart attacks, strokes, kidney disease, and other problems. A blood pressure reading consists of a higher number over a lower number. Ideally, your blood pressure should be below 120/80. The first ("top") number is called the systolic pressure. It is a measure of the pressure in your arteries as your heart beats. The second ("bottom") number is called the diastolic pressure. It is a measure of the pressure in your arteries as the heart relaxes. What are the causes? The cause of this condition is not known. What increases the risk? Some risk factors for high blood pressure are under your control. Others are not. Factors you can change  Smoking.  Having type 2 diabetes mellitus, high cholesterol, or both.  Not getting enough exercise or physical activity.  Being overweight.  Having too much fat, sugar, calories, or salt (sodium) in your diet.  Drinking too much alcohol. Factors that are difficult or impossible to change  Having chronic kidney disease.  Having a family history of high blood pressure.  Age. Risk increases with age.  Race. You may be at higher risk if you are African-American.  Gender. Men are at higher risk than women before age 45. After age 65, women are at higher risk than men.  Having obstructive sleep apnea.  Stress. What are the signs or symptoms? Extremely high blood pressure (hypertensive crisis) may cause:  Headache.  Anxiety.  Shortness of breath.  Nosebleed.  Nausea and vomiting.  Severe chest pain.  Jerky movements you cannot control (seizures).  How is this  diagnosed? This condition is diagnosed by measuring your blood pressure while you are seated, with your arm resting on a surface. The cuff of the blood pressure monitor will be placed directly against the skin of your upper arm at the level of your heart. It should be measured at least twice using the same arm. Certain conditions can cause a difference in blood pressure between your right and left arms. Certain factors can cause blood pressure readings to be lower or higher than normal (elevated) for a short period of time:  When your blood pressure is higher when you are in a health care provider's office than when you are at home, this is called white coat hypertension. Most people with this condition do not need medicines.  When your blood pressure is higher at home than when you are in a health care provider's office, this is called masked hypertension. Most people with this condition may need medicines to control blood pressure.  If you have a high blood pressure reading during one visit or you have normal blood pressure with other risk factors:  You may be asked to return on a different day to have your blood pressure checked again.  You may be asked to monitor your blood pressure at home for 1 week or longer.  If you are diagnosed with hypertension, you may have other blood or imaging tests to help your health care provider understand your overall risk for other conditions. How is this treated? This condition is treated by making healthy lifestyle changes, such as eating healthy foods, exercising more, and reducing your alcohol intake. Your   health care provider may prescribe medicine if lifestyle changes are not enough to get your blood pressure under control, and if:  Your systolic blood pressure is above 130.  Your diastolic blood pressure is above 80.  Your personal target blood pressure may vary depending on your medical conditions, your age, and other factors. Follow these  instructions at home: Eating and drinking  Eat a diet that is high in fiber and potassium, and low in sodium, added sugar, and fat. An example eating plan is called the DASH (Dietary Approaches to Stop Hypertension) diet. To eat this way: ? Eat plenty of fresh fruits and vegetables. Try to fill half of your plate at each meal with fruits and vegetables. ? Eat whole grains, such as whole wheat pasta, brown rice, or whole grain bread. Fill about one quarter of your plate with whole grains. ? Eat or drink low-fat dairy products, such as skim milk or low-fat yogurt. ? Avoid fatty cuts of meat, processed or cured meats, and poultry with skin. Fill about one quarter of your plate with lean proteins, such as fish, chicken without skin, beans, eggs, and tofu. ? Avoid premade and processed foods. These tend to be higher in sodium, added sugar, and fat.  Reduce your daily sodium intake. Most people with hypertension should eat less than 1,500 mg of sodium a day.  Limit alcohol intake to no more than 1 drink a day for nonpregnant women and 2 drinks a day for men. One drink equals 12 oz of beer, 5 oz of wine, or 1 oz of hard liquor. Lifestyle  Work with your health care provider to maintain a healthy body weight or to lose weight. Ask what an ideal weight is for you.  Get at least 30 minutes of exercise that causes your heart to beat faster (aerobic exercise) most days of the week. Activities may include walking, swimming, or biking.  Include exercise to strengthen your muscles (resistance exercise), such as pilates or lifting weights, as part of your weekly exercise routine. Try to do these types of exercises for 30 minutes at least 3 days a week.  Do not use any products that contain nicotine or tobacco, such as cigarettes and e-cigarettes. If you need help quitting, ask your health care provider.  Monitor your blood pressure at home as told by your health care provider.  Keep all follow-up visits as  told by your health care provider. This is important. Medicines  Take over-the-counter and prescription medicines only as told by your health care provider. Follow directions carefully. Blood pressure medicines must be taken as prescribed.  Do not skip doses of blood pressure medicine. Doing this puts you at risk for problems and can make the medicine less effective.  Ask your health care provider about side effects or reactions to medicines that you should watch for. Contact a health care provider if:  You think you are having a reaction to a medicine you are taking.  You have headaches that keep coming back (recurring).  You feel dizzy.  You have swelling in your ankles.  You have trouble with your vision. Get help right away if:  You develop a severe headache or confusion.  You have unusual weakness or numbness.  You feel faint.  You have severe pain in your chest or abdomen.  You vomit repeatedly.  You have trouble breathing. Summary  Hypertension is when the force of blood pumping through your arteries is too strong. If this condition is not   controlled, it may put you at risk for serious complications.  Your personal target blood pressure may vary depending on your medical conditions, your age, and other factors. For most people, a normal blood pressure is less than 120/80.  Hypertension is treated with lifestyle changes, medicines, or a combination of both. Lifestyle changes include weight loss, eating a healthy, low-sodium diet, exercising more, and limiting alcohol. This information is not intended to replace advice given to you by your health care provider. Make sure you discuss any questions you have with your health care provider. Document Released: 02/11/2005 Document Revised: 01/10/2016 Document Reviewed: 01/10/2016 Elsevier Interactive Patient Education  2018 Elsevier Inc.     IF you received an x-ray today, you will receive an invoice from [facility]. Please contact [facility] at [phone] with questions or concerns regarding your invoice.   IF you received labwork today, you will receive an invoice from [facility]. Please contact [facility] at [phone] with questions or concerns regarding your invoice.   Our billing staff will not be able to assist you with questions regarding bills from these companies.  You will be contacted with the lab results as soon as they are available. The fastest way to get your results is to activate your My Chart account. Instructions are located on the last page of this paperwork. If you have not heard from us regarding the results in 2 weeks, please contact this office.     

## 2017-07-14 NOTE — Progress Notes (Signed)
   MRN: [id number] DOB: [birth year]  Subjective:   Tammy Briggs is a 47 y.o. female presenting for follow up on Hypertension. Currently managed with lisinopril-hydrochlorothiazide.  Patient has made significant dietary modifications and has had approximately 60 pound weight loss since her last visit 1 year ago.  Reports that she is doing very well and feels great. Denies dizziness, chronic headache, blurred vision, chest pain, shortness of breath, heart racing, palpitations, nausea, vomiting, abdominal pain, hematuria, lower leg swelling. Denies smoking cigarettes.   Tammy Briggs has a current medication list which includes the following prescription(s): lisinopril-hydrochlorothiazide. Also has No Known Allergies.  Tammy Briggs  has a past medical history of Constipation, GERD (gastroesophageal reflux disease), History of bronchitis (3-13yrs ago), History of kidney stones, Hypertension, Joint swelling, Muscle spasm, Pneumonia (hx of-62yr ago), PONV (postoperative nausea and vomiting), and Weakness. Also  has a past surgical history that includes Myringotomy; adeoinectomy; HPV removed (age 17); Cholecystectomy (38yrs ago); and Lumbar laminectomy/decompression microdiscectomy (Left, 10/05/2013).  Objective:   Vitals: BP 138/80   Pulse 76   Temp 97.8 F (36.6 C) (Oral)   Resp 18   Ht 5' 5.75" (1.67 m)   Wt 259 lb 12.8 oz (117.8 kg)   SpO2 98%   BMI 42.25 kg/m   BP Readings from Last 3 Encounters:  07/14/17 138/80  07/18/16 122/88  06/10/15 120/90    Wt Readings from Last 3 Encounters:  07/14/17 259 lb 12.8 oz (117.8 kg)  07/18/16 (!) 319 lb 6.4 oz (144.9 kg)  06/10/15 (!) 327 lb (148.3 kg)    Physical Exam  Constitutional: She is oriented to person, place, and time. She appears well-developed and well-nourished.  HENT:  Mouth/Throat: Oropharynx is clear and moist.  Eyes: Pupils are equal, round, and reactive to light. EOM are normal. No scleral icterus.  Neck: Normal range of motion. Neck  supple. No thyromegaly present.  Cardiovascular: Normal rate, regular rhythm and intact distal pulses. Exam reveals no gallop and no friction rub.  No murmur heard. Pulmonary/Chest: No respiratory distress. She has no wheezes. She has no rales.  Musculoskeletal: She exhibits no edema.  Neurological: She is alert and oriented to person, place, and time.  Skin: Skin is warm and dry.  Psychiatric: She has a normal mood and affect.   Assessment and Plan :   Essential hypertension - Plan: Comprehensive metabolic panel, Lipid panel, Microalbumin / creatinine urine ratio, lisinopril-hydrochlorothiazide (PRINZIDE,ZESTORETIC) 20-25 MG tablet  Morbid obesity (HCC)  Congratulated patient on her weight loss, encouraged her to continue her lifestyle changes.  Refilled her medications for her current dose.  Patient can follow-up in 6 months to 1 year for blood pressure recheck.  Labs pending.  Wallis Bamberg, PA-C Primary Care at [facility] [phone] 07/14/2017  4:51 PM

## 2017-07-15 ENCOUNTER — Other Ambulatory Visit: Payer: Self-pay | Admitting: Urgent Care

## 2017-07-15 DIAGNOSIS — M545 Low back pain: Principal | ICD-10-CM

## 2017-07-15 DIAGNOSIS — G8929 Other chronic pain: Secondary | ICD-10-CM

## 2017-07-15 LAB — LIPID PANEL
CHOLESTEROL TOTAL: 159 mg/dL (ref 100–199)
Chol/HDL Ratio: 3.5 ratio (ref 0.0–4.4)
HDL: 45 mg/dL (ref >39)
LDL Calculated: 97 mg/dL (ref 0–99)
Triglycerides: 85 mg/dL (ref 0–149)
VLDL Cholesterol Cal: 17 mg/dL (ref 5–40)

## 2017-07-15 LAB — COMPREHENSIVE METABOLIC PANEL
A/G RATIO: 2 (ref 1.2–2.2)
ALK PHOS: 108 IU/L (ref 39–117)
ALT: 24 IU/L (ref 0–32)
AST: 21 IU/L (ref 0–40)
Albumin: 4.1 g/dL (ref 3.5–5.5)
BILIRUBIN TOTAL: 0.2 mg/dL (ref 0.0–1.2)
BUN / CREAT RATIO: 27 — AB (ref 9–23)
BUN: 15 mg/dL (ref 6–24)
CHLORIDE: 102 mmol/L (ref 96–106)
CO2: 23 mmol/L (ref 20–29)
Calcium: 9.5 mg/dL (ref 8.7–10.2)
Creatinine, Ser: 0.55 mg/dL — ABNORMAL LOW (ref 0.57–1.00)
GFR calc Af Amer: 129 mL/min/{1.73_m2} (ref >59)
GFR calc non Af Amer: 112 mL/min/{1.73_m2} (ref >59)
GLUCOSE: 84 mg/dL (ref 65–99)
Globulin, Total: 2.1 g/dL (ref 1.5–4.5)
POTASSIUM: 4 mmol/L (ref 3.5–5.2)
Sodium: 139 mmol/L (ref 134–144)
TOTAL PROTEIN: 6.2 g/dL (ref 6.0–8.5)

## 2017-07-15 LAB — MICROALBUMIN / CREATININE URINE RATIO: Creatinine, Urine: 9.7 mg/dL

## 2017-07-16 NOTE — Telephone Encounter (Signed)
mychart message sent to pt about making an apt °

## 2018-07-27 ENCOUNTER — Telehealth: Payer: Self-pay | Admitting: Family Medicine

## 2018-07-27 ENCOUNTER — Other Ambulatory Visit: Payer: Self-pay | Admitting: *Deleted

## 2018-07-27 DIAGNOSIS — I10 Essential (primary) hypertension: Secondary | ICD-10-CM

## 2018-07-27 MED ORDER — LISINOPRIL-HYDROCHLOROTHIAZIDE 20-25 MG PO TABS: 1.0000 | ORAL_TABLET | Freq: Every day | ORAL | 0 refills | Status: DC

## 2018-07-27 NOTE — Telephone Encounter (Signed)
Copied from CRM [id number]. Topic: Quick Communication - See Telephone Encounter >> Jul 27, 2018 11:24 AM Terisa Starr wrote: CRM for notification. See Telephone encounter for: 07/27/18. Patient needs a refill on her lisinopril-hydrochlorothiazide (PRINZIDE,ZESTORETIC) 20-25 MG tablet. I spoke with Barbara Cower, whom made her an appt and he asked to send this to clinical so she can get a refill until her appt. Thanks  [id number] - [place], [place] - [address] [place] [address] Phone: [phone] Fax: [phone]

## 2018-07-27 NOTE — Telephone Encounter (Signed)
Prescription sent

## 2018-08-04 ENCOUNTER — Encounter: Payer: 59 | Admitting: Registered Nurse

## 2018-08-07 ENCOUNTER — Ambulatory Visit: Payer: BC Managed Care – PPO | Admitting: Registered Nurse

## 2018-08-07 ENCOUNTER — Encounter: Payer: Self-pay | Admitting: Registered Nurse

## 2018-08-07 ENCOUNTER — Other Ambulatory Visit: Payer: Self-pay

## 2018-08-07 VITALS — BP 128/82 | Temp 97.8°F | Resp 18 | Ht 67.0 in

## 2018-08-07 DIAGNOSIS — Z130 Encounter for screening for diseases of the blood and blood-forming organs and certain disorders involving the immune mechanism: Secondary | ICD-10-CM | POA: Diagnosis not present

## 2018-08-07 DIAGNOSIS — Z13220 Encounter for screening for lipoid disorders: Secondary | ICD-10-CM | POA: Diagnosis not present

## 2018-08-07 DIAGNOSIS — Z23 Encounter for immunization: Secondary | ICD-10-CM | POA: Diagnosis not present

## 2018-08-07 DIAGNOSIS — Z13228 Encounter for screening for other metabolic disorders: Secondary | ICD-10-CM

## 2018-08-07 DIAGNOSIS — Z1329 Encounter for screening for other suspected endocrine disorder: Secondary | ICD-10-CM | POA: Diagnosis not present

## 2018-08-07 DIAGNOSIS — I10 Essential (primary) hypertension: Secondary | ICD-10-CM

## 2018-08-07 DIAGNOSIS — H9193 Unspecified hearing loss, bilateral: Secondary | ICD-10-CM | POA: Diagnosis not present

## 2018-08-07 MED ORDER — LISINOPRIL-HYDROCHLOROTHIAZIDE 20-25 MG PO TABS: 1.0000 | ORAL_TABLET | Freq: Every day | ORAL | 1 refills | Status: DC

## 2018-08-07 NOTE — Addendum Note (Signed)
Addended by: Dierdre Searles on: 08/07/2018 04:22 PM   Modules accepted: Orders

## 2018-08-07 NOTE — Progress Notes (Signed)
Established Patient Office Visit  Subjective:  Patient ID: Tammy Briggs, female    DOB: 1970/09/13  Age: 48 y.o. MRN: [id number]  CC:  Chief Complaint  Patient presents with  . Transitions Of Care  . Hypertension    HPI Tammy PeltChristy S Grimes presents for transfer of care visit, fasting labs, hearing problems  Formerly patient of Dr. Merla Richesoolittle and Wallis BambergMario Mani, PA-C. Managed for HTN.  HTN: Taking Zestoretic 20-25mg  PO qd with good effect. Wishes to continue.  Hearing issues: reports that last summer, she ruptured an ear drum twice. She does not recall which ear. Today she reports ongoing "roaring" in her ear. Denies ringing. Denies pain, discharge, swelling, sxs of infection. Reports that at baseline her hearing has been diminished since she was a child, she had multiple tubes put in at that time. She denies symptoms of vertigo, dizziness, loss of balance, loss of consciousness.   Past Medical History:  Diagnosis Date  . Constipation   . GERD (gastroesophageal reflux disease)    takes Nexium daily  . History of bronchitis 3-4464yrs ago  . History of kidney stones   . Hypertension    takes Lisinopril-HCTZ daily  . Joint swelling    right   . Muscle spasm    takes Flexeril daily as needed  . Pneumonia hx of-950yr ago  . PONV (postoperative nausea and vomiting)   . Weakness    numbness and tingling left leg    Past Surgical History:  Procedure Laterality Date  . adeoinectomy    . CHOLECYSTECTOMY  5133yrs ago  . HPV removed  age 48  . LUMBAR LAMINECTOMY/DECOMPRESSION MICRODISCECTOMY Left 10/05/2013   Procedure: LUMBAR LAMINECTOMY/DECOMPRESSION MICRODISCECTOMY 1 LEVEL L5-S1;  Surgeon: Reinaldo Meekerandy O Kritzer, MD;  Location: [facility];  Service: Neurosurgery;  Laterality: Left;  LUMBAR LAMINECTOMY/DECOMPRESSION MICRODISCECTOMY 1 LEVEL L5-S1  . MYRINGOTOMY     x 4    Family History  Problem Relation Age of Onset  . Hypertension Mother   . Hypertension Father   . Diabetes Father      Social History   Socioeconomic History  . Marital status: Married    Spouse name: Not on file  . Number of children: 2  . Years of education: Not on file  . Highest education level: Not on file  Occupational History  . Not on file  Social Needs  . Financial resource strain: Not hard at all  . Food insecurity    Worry: Never true    Inability: Never true  . Transportation needs    Medical: No    Non-medical: No  Tobacco Use  . Smoking status: Former Smoker    Packs/day: 0.50    Years: 15.00    Pack years: 7.50    Start date: 08/06/1997    Quit date: 08/06/2012    Years since quitting: 6.0  . Smokeless tobacco: Never Used  . Tobacco comment: quit smoking 03/22/13  Substance and Sexual Activity  . Alcohol use: No  . Drug use: No  . Sexual activity: Yes  Lifestyle  . Physical activity    Days per week: 0 days    Minutes per session: 0 min  . Stress: Only a little  Relationships  . Social Musicianconnections    Talks on phone: Three times a week    Gets together: Twice a week    Attends religious service: 1 to 4 times per year    Active member of club or organization: No  Attends meetings of clubs or organizations: Never    Relationship status: Married  . Intimate partner violence    Fear of current or ex partner: No    Emotionally abused: No    Physically abused: No    Forced sexual activity: No  Other Topics Concern  . Not on file  Social History Narrative  . Not on file    Outpatient Medications Prior to Visit  Medication Sig Dispense Refill  . lisinopril-hydrochlorothiazide (ZESTORETIC) 20-25 MG tablet Take 1 tablet by mouth daily. 30 tablet 0   No facility-administered medications prior to visit.     No Known Allergies  ROS Review of Systems  Constitutional: Negative.  Negative for chills and fever.  HENT: Positive for hearing loss. Negative for congestion, ear discharge, ear pain, facial swelling, rhinorrhea, sinus pressure, sinus pain, tinnitus and  trouble swallowing.   Eyes: Negative.  Negative for visual disturbance.  Respiratory: Negative.  Negative for chest tightness and shortness of breath.   Cardiovascular: Negative.  Negative for chest pain.  Gastrointestinal: Negative.   Endocrine: Negative.   Genitourinary: Negative.   Musculoskeletal: Negative.   Skin: Negative.   Allergic/Immunologic: Negative.   Neurological: Negative.   Hematological: Negative.   Psychiatric/Behavioral: Negative.   All other systems reviewed and are negative.     Objective:    Physical Exam  Constitutional: She is oriented to person, place, and time. She appears well-developed and well-nourished.  HENT:  Head: Normocephalic and atraumatic.  Right Ear: External ear and ear canal normal. No drainage, swelling or tenderness. No foreign bodies. Tympanic membrane is scarred. Tympanic membrane is not injected, not perforated and not erythematous. No middle ear effusion.  Left Ear: External ear and ear canal normal. No drainage, swelling or tenderness. No foreign bodies. Tympanic membrane is scarred. Tympanic membrane is not injected, not perforated and not erythematous.  No middle ear effusion.  Scarring on both TM  Neck: Normal range of motion.  Cardiovascular: Normal rate, regular rhythm, normal heart sounds and intact distal pulses. Exam reveals no gallop and no friction rub.  No murmur heard. Pulmonary/Chest: Effort normal and breath sounds normal. No respiratory distress. She has no wheezes. She has no rales. She exhibits no tenderness.  Neurological: She is alert and oriented to person, place, and time. No cranial nerve deficit.  Skin: Skin is warm and dry. No rash noted. No erythema. No pallor.  Psychiatric: She has a normal mood and affect. Her behavior is normal. Judgment and thought content normal.    BP 128/82   Temp 97.8 F (36.6 C) (Oral)   Resp 18   Ht 5\' 7"  (1.702 m)   SpO2 96%   BMI 40.69 kg/m  Wt Readings from Last 3  Encounters:  07/14/17 259 lb 12.8 oz (117.8 kg)  07/18/16 (!) 319 lb 6.4 oz (144.9 kg)  06/10/15 (!) 327 lb (148.3 kg)     Health Maintenance Due  Topic Date Due  . TETANUS/TDAP  03/09/1989  . PAP SMEAR-Modifier  07/27/2018    There are no preventive care reminders to display for this patient.  Lab Results  Component Value Date   TSH 1.354 07/12/2013   Lab Results  Component Value Date   WBC 8.2 04/09/2014   HGB 12.1 (A) 04/09/2014   HCT 38.9 04/09/2014   MCV 82.1 04/09/2014   PLT 268 09/28/2013   Lab Results  Component Value Date   NA 139 07/14/2017   K 4.0 07/14/2017   CO2 23  07/14/2017   GLUCOSE 84 07/14/2017   BUN 15 07/14/2017   CREATININE 0.55 (L) 07/14/2017   BILITOT 0.2 07/14/2017   ALKPHOS 108 07/14/2017   AST 21 07/14/2017   ALT 24 07/14/2017   PROT 6.2 07/14/2017   ALBUMIN 4.1 07/14/2017   CALCIUM 9.5 07/14/2017   ANIONGAP 14 09/28/2013   Lab Results  Component Value Date   CHOL 159 07/14/2017   Lab Results  Component Value Date   HDL 45 07/14/2017   Lab Results  Component Value Date   LDLCALC 97 07/14/2017   Lab Results  Component Value Date   TRIG 85 07/14/2017   Lab Results  Component Value Date   CHOLHDL 3.5 07/14/2017   No results found for: HGBA1C    Assessment & Plan:   Problem List Items Addressed This Visit    None    Visit Diagnoses    Screening for endocrine, metabolic and immunity disorder    -  Primary   Relevant Orders   CBC   Comprehensive metabolic panel   Hemoglobin A1c   TSH   Lipid screening       Relevant Orders   Lipid panel   Hearing difficulty of both ears       Relevant Orders   Ambulatory referral to ENT   Essential hypertension       Relevant Medications   lisinopril-hydrochlorothiazide (ZESTORETIC) 20-25 MG tablet   Benign hypertension       Relevant Medications   lisinopril-hydrochlorothiazide (ZESTORETIC) 20-25 MG tablet      Meds ordered this encounter  Medications  .  lisinopril-hydrochlorothiazide (ZESTORETIC) 20-25 MG tablet    Sig: Take 1 tablet by mouth daily.    Dispense:  90 tablet    Refill:  1    Order Specific Question:   Supervising Provider    Answer:   Forrest Moron [id number]    Follow-up: Return in 6 months (on 02/06/2019).   PLAN:  Referral to ENT for hearing difficulties - it does not appear on exam or taking history to be an ear infection - given that she is unsure of which TM was perforated, further workup is necessary and this patient would benefit from seeing an ENT.  Pt has regular GYN provider - will be getting exam and Pap soon.  Pt BP continues to be under control, though we did discuss benefits of diet and exercise in relation to BP. There is hope she could limit her dose or even cease medication in the future if she diets, exercises, and loses weight. However, concern is limited at this time, as she is overall healthy.  Labs ordered. Will follow up as warranted. Reviewed past labs.  Patient encouraged to call clinic with any questions, comments, or concerns.    Maximiano Coss, NP

## 2018-08-07 NOTE — Patient Instructions (Addendum)
   If you have lab work done today you will be contacted with your lab results within the next 2 weeks.  If you have not heard from us then please contact us. The fastest way to get your results is to register for My Chart.   IF you received an x-ray today, you will receive an invoice from [facility]. Please contact [facility] at [phone] with questions or concerns regarding your invoice.   IF you received labwork today, you will receive an invoice from [facility]. Please contact [facility] at [phone] with questions or concerns regarding your invoice.   Our billing staff will not be able to assist you with questions regarding bills from these companies.  You will be contacted with the lab results as soon as they are available. The fastest way to get your results is to activate your My Chart account. Instructions are located on the last page of this paperwork. If you have not heard from us regarding the results in 2 weeks, please contact this office.       Hypertension Hypertension, commonly called high blood pressure, is when the force of blood pumping through the arteries is too strong. The arteries are the blood vessels that carry blood from the heart throughout the body. Hypertension forces the heart to work harder to pump blood and may cause arteries to become narrow or stiff. Having untreated or uncontrolled hypertension can cause heart attacks, strokes, kidney disease, and other problems. A blood pressure reading consists of a higher number over a lower number. Ideally, your blood pressure should be below 120/80. The first ("top") number is called the systolic pressure. It is a measure of the pressure in your arteries as your heart beats. The second ("bottom") number is called the diastolic pressure. It is a measure of the pressure in your arteries as the heart relaxes. What are the causes? The cause of this condition is not known. What increases the  risk? Some risk factors for high blood pressure are under your control. Others are not. Factors you can change  Smoking.  Having type 2 diabetes mellitus, high cholesterol, or both.  Not getting enough exercise or physical activity.  Being overweight.  Having too much fat, sugar, calories, or salt (sodium) in your diet.  Drinking too much alcohol. Factors that are difficult or impossible to change  Having chronic kidney disease.  Having a family history of high blood pressure.  Age. Risk increases with age.  Race. You may be at higher risk if you are African-American.  Gender. Men are at higher risk than women before age 45. After age 65, women are at higher risk than men.  Having obstructive sleep apnea.  Stress. What are the signs or symptoms? Extremely high blood pressure (hypertensive crisis) may cause:  Headache.  Anxiety.  Shortness of breath.  Nosebleed.  Nausea and vomiting.  Severe chest pain.  Jerky movements you cannot control (seizures). How is this diagnosed? This condition is diagnosed by measuring your blood pressure while you are seated, with your arm resting on a surface. The cuff of the blood pressure monitor will be placed directly against the skin of your upper arm at the level of your heart. It should be measured at least twice using the same arm. Certain conditions can cause a difference in blood pressure between your right and left arms. Certain factors can cause blood pressure readings to be lower or higher than normal (elevated) for a short period of time:    When your blood pressure is higher when you are in a health care provider's office than when you are at home, this is called white coat hypertension. Most people with this condition do not need medicines.  When your blood pressure is higher at home than when you are in a health care provider's office, this is called masked hypertension. Most people with this condition may need medicines  to control blood pressure. If you have a high blood pressure reading during one visit or you have normal blood pressure with other risk factors:  You may be asked to return on a different day to have your blood pressure checked again.  You may be asked to monitor your blood pressure at home for 1 week or longer. If you are diagnosed with hypertension, you may have other blood or imaging tests to help your health care provider understand your overall risk for other conditions. How is this treated? This condition is treated by making healthy lifestyle changes, such as eating healthy foods, exercising more, and reducing your alcohol intake. Your health care provider may prescribe medicine if lifestyle changes are not enough to get your blood pressure under control, and if:  Your systolic blood pressure is above 130.  Your diastolic blood pressure is above 80. Your personal target blood pressure may vary depending on your medical conditions, your age, and other factors. Follow these instructions at home: Eating and drinking   Eat a diet that is high in fiber and potassium, and low in sodium, added sugar, and fat. An example eating plan is called the DASH (Dietary Approaches to Stop Hypertension) diet. To eat this way: ? Eat plenty of fresh fruits and vegetables. Try to fill half of your plate at each meal with fruits and vegetables. ? Eat whole grains, such as whole wheat pasta, brown rice, or whole grain bread. Fill about one quarter of your plate with whole grains. ? Eat or drink low-fat dairy products, such as skim milk or low-fat yogurt. ? Avoid fatty cuts of meat, processed or cured meats, and poultry with skin. Fill about one quarter of your plate with lean proteins, such as fish, chicken without skin, beans, eggs, and tofu. ? Avoid premade and processed foods. These tend to be higher in sodium, added sugar, and fat.  Reduce your daily sodium intake. Most people with hypertension should  eat less than 1,500 mg of sodium a day.  Limit alcohol intake to no more than 1 drink a day for nonpregnant women and 2 drinks a day for men. One drink equals 12 oz of beer, 5 oz of wine, or 1 oz of hard liquor. Lifestyle   Work with your health care provider to maintain a healthy body weight or to lose weight. Ask what an ideal weight is for you.  Get at least 30 minutes of exercise that causes your heart to beat faster (aerobic exercise) most days of the week. Activities may include walking, swimming, or biking.  Include exercise to strengthen your muscles (resistance exercise), such as pilates or lifting weights, as part of your weekly exercise routine. Try to do these types of exercises for 30 minutes at least 3 days a week.  Do not use any products that contain nicotine or tobacco, such as cigarettes and e-cigarettes. If you need help quitting, ask your health care provider.  Monitor your blood pressure at home as told by your health care provider.  Keep all follow-up visits as told by your health care provider.   This is important. Medicines  Take over-the-counter and prescription medicines only as told by your health care provider. Follow directions carefully. Blood pressure medicines must be taken as prescribed.  Do not skip doses of blood pressure medicine. Doing this puts you at risk for problems and can make the medicine less effective.  Ask your health care provider about side effects or reactions to medicines that you should watch for. Contact a health care provider if:  You think you are having a reaction to a medicine you are taking.  You have headaches that keep coming back (recurring).  You feel dizzy.  You have swelling in your ankles.  You have trouble with your vision. Get help right away if:  You develop a severe headache or confusion.  You have unusual weakness or numbness.  You feel faint.  You have severe pain in your chest or abdomen.  You vomit  repeatedly.  You have trouble breathing. Summary  Hypertension is when the force of blood pumping through your arteries is too strong. If this condition is not controlled, it may put you at risk for serious complications.  Your personal target blood pressure may vary depending on your medical conditions, your age, and other factors. For most people, a normal blood pressure is less than 120/80.  Hypertension is treated with lifestyle changes, medicines, or a combination of both. Lifestyle changes include weight loss, eating a healthy, low-sodium diet, exercising more, and limiting alcohol. This information is not intended to replace advice given to you by your health care provider. Make sure you discuss any questions you have with your health care provider. Document Released: 02/11/2005 Document Revised: 01/10/2016 Document Reviewed: 01/10/2016 Elsevier Interactive Patient Education  2019 [employer]   Managing Your Hypertension Hypertension is commonly called high blood pressure. This is when the force of your blood pressing against the walls of your arteries is too strong. Arteries are blood vessels that carry blood from your heart throughout your body. Hypertension forces the heart to work harder to pump blood, and may cause the arteries to become narrow or stiff. Having untreated or uncontrolled hypertension can cause heart attack, stroke, kidney disease, and other problems. What are blood pressure readings? A blood pressure reading consists of a higher number over a lower number. Ideally, your blood pressure should be below 120/80. The first ("top") number is called the systolic pressure. It is a measure of the pressure in your arteries as your heart beats. The second ("bottom") number is called the diastolic pressure. It is a measure of the pressure in your arteries as the heart relaxes. What does my blood pressure reading mean? Blood pressure is classified into four stages. Based on your  blood pressure reading, your health care provider may use the following stages to determine what type of treatment you need, if any. Systolic pressure and diastolic pressure are measured in a unit called mm Hg. Normal  Systolic pressure: below 120.  Diastolic pressure: below 80. Elevated  Systolic pressure: 120-129.  Diastolic pressure: below 80. Hypertension stage 1  Systolic pressure: 130-139.  Diastolic pressure: 80-89. Hypertension stage 2  Systolic pressure: 140 or above.  Diastolic pressure: 90 or above. What health risks are associated with hypertension? Managing your hypertension is an important responsibility. Uncontrolled hypertension can lead to:  A heart attack.  A stroke.  A weakened blood vessel (aneurysm).  Heart failure.  Kidney damage.  Eye damage.  Metabolic syndrome.  Memory and concentration problems. What changes can I make   to manage my hypertension? Hypertension can be managed by making lifestyle changes and possibly by taking medicines. Your health care provider will help you make a plan to bring your blood pressure within a normal range. Eating and drinking   Eat a diet that is high in fiber and potassium, and low in salt (sodium), added sugar, and fat. An example eating plan is called the DASH (Dietary Approaches to Stop Hypertension) diet. To eat this way: ? Eat plenty of fresh fruits and vegetables. Try to fill half of your plate at each meal with fruits and vegetables. ? Eat whole grains, such as whole wheat pasta, brown rice, or whole grain bread. Fill about one quarter of your plate with whole grains. ? Eat low-fat diary products. ? Avoid fatty cuts of meat, processed or cured meats, and poultry with skin. Fill about one quarter of your plate with lean proteins such as fish, chicken without skin, beans, eggs, and tofu. ? Avoid premade and processed foods. These tend to be higher in sodium, added sugar, and fat.  Reduce your daily sodium  intake. Most people with hypertension should eat less than 1,500 mg of sodium a day.  Limit alcohol intake to no more than 1 drink a day for nonpregnant women and 2 drinks a day for men. One drink equals 12 oz of beer, 5 oz of wine, or 1 oz of hard liquor. Lifestyle  Work with your health care provider to maintain a healthy body weight, or to lose weight. Ask what an ideal weight is for you.  Get at least 30 minutes of exercise that causes your heart to beat faster (aerobic exercise) most days of the week. Activities may include walking, swimming, or biking.  Include exercise to strengthen your muscles (resistance exercise), such as weight lifting, as part of your weekly exercise routine. Try to do these types of exercises for 30 minutes at least 3 days a week.  Do not use any products that contain nicotine or tobacco, such as cigarettes and e-cigarettes. If you need help quitting, ask your health care provider.  Control any long-term (chronic) conditions you have, such as high cholesterol or diabetes. Monitoring  Monitor your blood pressure at home as told by your health care provider. Your personal target blood pressure may vary depending on your medical conditions, your age, and other factors.  Have your blood pressure checked regularly, as often as told by your health care provider. Working with your health care provider  Review all the medicines you take with your health care provider because there may be side effects or interactions.  Talk with your health care provider about your diet, exercise habits, and other lifestyle factors that may be contributing to hypertension.  Visit your health care provider regularly. Your health care provider can help you create and adjust your plan for managing hypertension. Will I need medicine to control my blood pressure? Your health care provider may prescribe medicine if lifestyle changes are not enough to get your blood pressure under control,  and if:  Your systolic blood pressure is 130 or higher.  Your diastolic blood pressure is 80 or higher. Take medicines only as told by your health care provider. Follow the directions carefully. Blood pressure medicines must be taken as prescribed. The medicine does not work as well when you skip doses. Skipping doses also puts you at risk for problems. Contact a health care provider if:  You think you are having a reaction to medicines you have   taken.  You have repeated (recurrent) headaches.  You feel dizzy.  You have swelling in your ankles.  You have trouble with your vision. Get help right away if:  You develop a severe headache or confusion.  You have unusual weakness or numbness, or you feel faint.  You have severe pain in your chest or abdomen.  You vomit repeatedly.  You have trouble breathing. Summary  Hypertension is when the force of blood pumping through your arteries is too strong. If this condition is not controlled, it may put you at risk for serious complications.  Your personal target blood pressure may vary depending on your medical conditions, your age, and other factors. For most people, a normal blood pressure is less than 120/80.  Hypertension is managed by lifestyle changes, medicines, or both. Lifestyle changes include weight loss, eating a healthy, low-sodium diet, exercising more, and limiting alcohol. This information is not intended to replace advice given to you by your health care provider. Make sure you discuss any questions you have with your health care provider. Document Released: 11/06/2011 Document Revised: 01/10/2016 Document Reviewed: 01/10/2016 Elsevier Interactive Patient Education  2019 [employer]   DASH Eating Plan DASH stands for "Dietary Approaches to Stop Hypertension." The DASH eating plan is a healthy eating plan that has been shown to reduce high blood pressure (hypertension). It may also reduce your risk for type 2  diabetes, heart disease, and stroke. The DASH eating plan may also help with weight loss. What are tips for following this plan?  General guidelines  Avoid eating more than 2,300 mg (milligrams) of salt (sodium) a day. If you have hypertension, you may need to reduce your sodium intake to 1,500 mg a day.  Limit alcohol intake to no more than 1 drink a day for nonpregnant women and 2 drinks a day for men. One drink equals 12 oz of beer, 5 oz of wine, or 1 oz of hard liquor.  Work with your health care provider to maintain a healthy body weight or to lose weight. Ask what an ideal weight is for you.  Get at least 30 minutes of exercise that causes your heart to beat faster (aerobic exercise) most days of the week. Activities may include walking, swimming, or biking.  Work with your health care provider or diet and nutrition specialist (dietitian) to adjust your eating plan to your individual calorie needs. Reading food labels   Check food labels for the amount of sodium per serving. Choose foods with less than 5 percent of the Daily Value of sodium. Generally, foods with less than 300 mg of sodium per serving fit into this eating plan.  To find whole grains, look for the word "whole" as the first word in the ingredient list. Shopping  Buy products labeled as "low-sodium" or "no salt added."  Buy fresh foods. Avoid canned foods and premade or frozen meals. Cooking  Avoid adding salt when cooking. Use salt-free seasonings or herbs instead of table salt or sea salt. Check with your health care provider or pharmacist before using salt substitutes.  Do not fry foods. Cook foods using healthy methods such as baking, boiling, grilling, and broiling instead.  Cook with heart-healthy oils, such as olive, canola, soybean, or sunflower oil. Meal planning  Eat a balanced diet that includes: ? 5 or more servings of fruits and vegetables each day. At each meal, try to fill half of your plate with  fruits and vegetables. ? Up to 6-8 servings of   whole grains each day. ? Less than 6 oz of lean meat, poultry, or fish each day. A 3-oz serving of meat is about the same size as a deck of cards. One egg equals 1 oz. ? 2 servings of low-fat dairy each day. ? A serving of nuts, seeds, or beans 5 times each week. ? Heart-healthy fats. Healthy fats called Omega-3 fatty acids are found in foods such as flaxseeds and coldwater fish, like sardines, salmon, and mackerel.  Limit how much you eat of the following: ? Canned or prepackaged foods. ? Food that is high in trans fat, such as fried foods. ? Food that is high in saturated fat, such as fatty meat. ? Sweets, desserts, sugary drinks, and other foods with added sugar. ? Full-fat dairy products.  Do not salt foods before eating.  Try to eat at least 2 vegetarian meals each week.  Eat more home-cooked food and less restaurant, buffet, and fast food.  When eating at a restaurant, ask that your food be prepared with less salt or no salt, if possible. What foods are recommended? The items listed may not be a complete list. Talk with your dietitian about what dietary choices are best for you. Grains Whole-grain or whole-wheat bread. Whole-grain or whole-wheat pasta. Brown rice. Oatmeal. Quinoa. Bulgur. Whole-grain and low-sodium cereals. Pita bread. Low-fat, low-sodium crackers. Whole-wheat flour tortillas. Vegetables Fresh or frozen vegetables (raw, steamed, roasted, or grilled). Low-sodium or reduced-sodium tomato and vegetable juice. Low-sodium or reduced-sodium tomato sauce and tomato paste. Low-sodium or reduced-sodium canned vegetables. Fruits All fresh, dried, or frozen fruit. Canned fruit in natural juice (without added sugar). Meat and other protein foods Skinless chicken or turkey. Ground chicken or turkey. Pork with fat trimmed off. Fish and seafood. Egg whites. Dried beans, peas, or lentils. Unsalted nuts, nut butters, and seeds.  Unsalted canned beans. Lean cuts of beef with fat trimmed off. Low-sodium, lean deli meat. Dairy Low-fat (1%) or fat-free (skim) milk. Fat-free, low-fat, or reduced-fat cheeses. Nonfat, low-sodium ricotta or cottage cheese. Low-fat or nonfat yogurt. Low-fat, low-sodium cheese. Fats and oils Soft margarine without trans fats. Vegetable oil. Low-fat, reduced-fat, or light mayonnaise and salad dressings (reduced-sodium). Canola, safflower, olive, soybean, and sunflower oils. Avocado. Seasoning and other foods Herbs. Spices. Seasoning mixes without salt. Unsalted popcorn and pretzels. Fat-free sweets. What foods are not recommended? The items listed may not be a complete list. Talk with your dietitian about what dietary choices are best for you. Grains Baked goods made with fat, such as croissants, muffins, or some breads. Dry pasta or rice meal packs. Vegetables Creamed or fried vegetables. Vegetables in a cheese sauce. Regular canned vegetables (not low-sodium or reduced-sodium). Regular canned tomato sauce and paste (not low-sodium or reduced-sodium). Regular tomato and vegetable juice (not low-sodium or reduced-sodium). Pickles. Olives. Fruits Canned fruit in a light or heavy syrup. Fried fruit. Fruit in cream or butter sauce. Meat and other protein foods Fatty cuts of meat. Ribs. Fried meat. Bacon. Sausage. Bologna and other processed lunch meats. Salami. Fatback. Hotdogs. Bratwurst. Salted nuts and seeds. Canned beans with added salt. Canned or smoked fish. Whole eggs or egg yolks. Chicken or turkey with skin. Dairy Whole or 2% milk, cream, and half-and-half. Whole or full-fat cream cheese. Whole-fat or sweetened yogurt. Full-fat cheese. Nondairy creamers. Whipped toppings. Processed cheese and cheese spreads. Fats and oils Butter. Stick margarine. Lard. Shortening. Ghee. Bacon fat. Tropical oils, such as coconut, palm kernel, or palm oil. Seasoning and other foods Salted popcorn   and  pretzels. Onion salt, garlic salt, seasoned salt, table salt, and sea salt. Worcestershire sauce. Tartar sauce. Barbecue sauce. Teriyaki sauce. Soy sauce, including reduced-sodium. Steak sauce. Canned and packaged gravies. Fish sauce. Oyster sauce. Cocktail sauce. Horseradish that you find on the shelf. Ketchup. Mustard. Meat flavorings and tenderizers. Bouillon cubes. Hot sauce and Tabasco sauce. Premade or packaged marinades. Premade or packaged taco seasonings. Relishes. Regular salad dressings. Where to find more information:  National Heart, Lung, and Blood Institute: www.nhlbi.nih.gov  American Heart Association: www.heart.org Summary  The DASH eating plan is a healthy eating plan that has been shown to reduce high blood pressure (hypertension). It may also reduce your risk for type 2 diabetes, heart disease, and stroke.  With the DASH eating plan, you should limit salt (sodium) intake to 2,300 mg a day. If you have hypertension, you may need to reduce your sodium intake to 1,500 mg a day.  When on the DASH eating plan, aim to eat more fresh fruits and vegetables, whole grains, lean proteins, low-fat dairy, and heart-healthy fats.  Work with your health care provider or diet and nutrition specialist (dietitian) to adjust your eating plan to your individual calorie needs. This information is not intended to replace advice given to you by your health care provider. Make sure you discuss any questions you have with your health care provider. Document Released: 01/31/2011 Document Revised: 02/05/2016 Document Reviewed: 02/05/2016 Elsevier Interactive Patient Education  2019 [employer] Inc.   Low-Sodium Eating Plan Sodium, which is an element that makes up salt, helps you maintain a healthy balance of fluids in your body. Too much sodium can increase your blood pressure and cause fluid and waste to be held in your body. Your health care provider or dietitian may recommend following this plan if  you have high blood pressure (hypertension), kidney disease, liver disease, or heart failure. Eating less sodium can help lower your blood pressure, reduce swelling, and protect your heart, liver, and kidneys. What are tips for following this plan? General guidelines  Most people on this plan should limit their sodium intake to 1,500-2,000 mg (milligrams) of sodium each day. Reading food labels   The Nutrition Facts label lists the amount of sodium in one serving of the food. If you eat more than one serving, you must multiply the listed amount of sodium by the number of servings.  Choose foods with less than 140 mg of sodium per serving.  Avoid foods with 300 mg of sodium or more per serving. Shopping  Look for lower-sodium products, often labeled as "low-sodium" or "no salt added."  Always check the sodium content even if foods are labeled as "unsalted" or "no salt added".  Buy fresh foods. ? Avoid canned foods and premade or frozen meals. ? Avoid canned, cured, or processed meats  Buy breads that have less than 80 mg of sodium per slice. Cooking  Eat more home-cooked food and less restaurant, buffet, and fast food.  Avoid adding salt when cooking. Use salt-free seasonings or herbs instead of table salt or sea salt. Check with your health care provider or pharmacist before using salt substitutes.  Cook with plant-based oils, such as canola, sunflower, or olive oil. Meal planning  When eating at a restaurant, ask that your food be prepared with less salt or no salt, if possible.  Avoid foods that contain MSG (monosodium glutamate). MSG is sometimes added to Chinese food, bouillon, and some canned foods. What foods are recommended? The items listed may   not be a complete list. Talk with your dietitian about what dietary choices are best for you. Grains Low-sodium cereals, including oats, puffed wheat and rice, and shredded wheat. Low-sodium crackers. Unsalted rice. Unsalted  pasta. Low-sodium bread. Whole-grain breads and whole-grain pasta. Vegetables Fresh or frozen vegetables. "No salt added" canned vegetables. "No salt added" tomato sauce and paste. Low-sodium or reduced-sodium tomato and vegetable juice. Fruits Fresh, frozen, or canned fruit. Fruit juice. Meats and other protein foods Fresh or frozen (no salt added) meat, poultry, seafood, and fish. Low-sodium canned tuna and salmon. Unsalted nuts. Dried peas, beans, and lentils without added salt. Unsalted canned beans. Eggs. Unsalted nut butters. Dairy Milk. Soy milk. Cheese that is naturally low in sodium, such as ricotta cheese, fresh mozzarella, or Swiss cheese Low-sodium or reduced-sodium cheese. Cream cheese. Yogurt. Fats and oils Unsalted butter. Unsalted margarine with no trans fat. Vegetable oils such as canola or olive oils. Seasonings and other foods Fresh and dried herbs and spices. Salt-free seasonings. Low-sodium mustard and ketchup. Sodium-free salad dressing. Sodium-free light mayonnaise. Fresh or refrigerated horseradish. Lemon juice. Vinegar. Homemade, reduced-sodium, or low-sodium soups. Unsalted popcorn and pretzels. Low-salt or salt-free chips. What foods are not recommended? The items listed may not be a complete list. Talk with your dietitian about what dietary choices are best for you. Grains Instant hot cereals. Bread stuffing, pancake, and biscuit mixes. Croutons. Seasoned rice or pasta mixes. Noodle soup cups. Boxed or frozen macaroni and cheese. Regular salted crackers. Self-rising flour. Vegetables Sauerkraut, pickled vegetables, and relishes. Olives. French fries. Onion rings. Regular canned vegetables (not low-sodium or reduced-sodium). Regular canned tomato sauce and paste (not low-sodium or reduced-sodium). Regular tomato and vegetable juice (not low-sodium or reduced-sodium). Frozen vegetables in sauces. Meats and other protein foods Meat or fish that is salted, canned, smoked,  spiced, or pickled. Bacon, ham, sausage, hotdogs, corned beef, chipped beef, packaged lunch meats, salt pork, jerky, pickled herring, anchovies, regular canned tuna, sardines, salted nuts. Dairy Processed cheese and cheese spreads. Cheese curds. Blue cheese. Feta cheese. String cheese. Regular cottage cheese. Buttermilk. Canned milk. Fats and oils Salted butter. Regular margarine. Ghee. Bacon fat. Seasonings and other foods Onion salt, garlic salt, seasoned salt, table salt, and sea salt. Canned and packaged gravies. Worcestershire sauce. Tartar sauce. Barbecue sauce. Teriyaki sauce. Soy sauce, including reduced-sodium. Steak sauce. Fish sauce. Oyster sauce. Cocktail sauce. Horseradish that you find on the shelf. Regular ketchup and mustard. Meat flavorings and tenderizers. Bouillon cubes. Hot sauce and Tabasco sauce. Premade or packaged marinades. Premade or packaged taco seasonings. Relishes. Regular salad dressings. Salsa. Potato and tortilla chips. Corn chips and puffs. Salted popcorn and pretzels. Canned or dried soups. Pizza. Frozen entrees and pot pies. Summary  Eating less sodium can help lower your blood pressure, reduce swelling, and protect your heart, liver, and kidneys.  Most people on this plan should limit their sodium intake to 1,500-2,000 mg (milligrams) of sodium each day.  Canned, boxed, and frozen foods are high in sodium. Restaurant foods, fast foods, and pizza are also very high in sodium. You also get sodium by adding salt to food.  Try to cook at home, eat more fresh fruits and vegetables, and eat less fast food, canned, processed, or prepared foods. This information is not intended to replace advice given to you by your health care provider. Make sure you discuss any questions you have with your health care provider. Document Released: 08/03/2001 Document Revised: 02/05/2016 Document Reviewed: 02/05/2016 Elsevier Interactive Patient Education    2019 Elsevier Inc.     Why  follow it? Research shows. . Those who follow the Mediterranean diet have a reduced risk of heart disease  . The diet is associated with a reduced incidence of Parkinson's and Alzheimer's diseases . People following the diet may have longer life expectancies and lower rates of chronic diseases  . The Dietary Guidelines for Americans recommends the Mediterranean diet as an eating plan to promote health and prevent disease  What Is the Mediterranean Diet?  . Healthy eating plan based on typical foods and recipes of Mediterranean-style cooking . The diet is primarily a plant based diet; these foods should make up a majority of meals   Starches - Plant based foods should make up a majority of meals - They are an important sources of vitamins, minerals, energy, antioxidants, and fiber - Choose whole grains, foods high in fiber and minimally processed items  - Typical grain sources include wheat, oats, barley, corn, brown rice, bulgar, farro, millet, polenta, couscous  - Various types of beans include chickpeas, lentils, fava beans, black beans, white beans   Fruits  Veggies - Large quantities of antioxidant rich fruits & veggies; 6 or more servings  - Vegetables can be eaten raw or lightly drizzled with oil and cooked  - Vegetables common to the traditional Mediterranean Diet include: artichokes, arugula, beets, broccoli, brussel sprouts, cabbage, carrots, celery, collard greens, cucumbers, eggplant, kale, leeks, lemons, lettuce, mushrooms, okra, onions, peas, peppers, potatoes, pumpkin, radishes, rutabaga, shallots, spinach, sweet potatoes, turnips, zucchini - Fruits common to the Mediterranean Diet include: apples, apricots, avocados, cherries, clementines, dates, figs, grapefruits, grapes, melons, nectarines, oranges, peaches, pears, pomegranates, strawberries, tangerines  Fats - Replace butter and margarine with healthy oils, such as olive oil, canola oil, and tahini  - Limit nuts to no more than  a handful a day  - Nuts include walnuts, almonds, pecans, pistachios, pine nuts  - Limit or avoid candied, honey roasted or heavily salted nuts - Olives are central to the Mediterranean diet - can be eaten whole or used in a variety of dishes   Meats Protein - Limiting red meat: no more than a few times a month - When eating red meat: choose lean cuts and keep the portion to the size of deck of cards - Eggs: approx. 0 to 4 times a week  - Fish and lean poultry: at least 2 a week  - Healthy protein sources include, chicken, turkey, lean beef, lamb - Increase intake of seafood such as tuna, salmon, trout, mackerel, shrimp, scallops - Avoid or limit high fat processed meats such as sausage and bacon  Dairy - Include moderate amounts of low fat dairy products  - Focus on healthy dairy such as fat free yogurt, skim milk, low or reduced fat cheese - Limit dairy products higher in fat such as whole or 2% milk, cheese, ice cream  Alcohol - Moderate amounts of red wine is ok  - No more than 5 oz daily for women (all ages) and men older than age 65  - No more than 10 oz of wine daily for men younger than 65  Other - Limit sweets and other desserts  - Use herbs and spices instead of salt to flavor foods  - Herbs and spices common to the traditional Mediterranean Diet include: basil, bay leaves, chives, cloves, cumin, fennel, garlic, lavender, marjoram, mint, oregano, parsley, pepper, rosemary, sage, savory, sumac, tarragon, thyme   It's not just a diet, it's   a lifestyle:  . The Mediterranean diet includes lifestyle factors typical of those in the region  . Foods, drinks and meals are best eaten with others and savored . Daily physical activity is important for overall good health . This could be strenuous exercise like running and aerobics . This could also be more leisurely activities such as walking, housework, yard-work, or taking the stairs . Moderation is the key; a balanced and healthy diet  accommodates most foods and drinks . Consider portion sizes and frequency of consumption of certain foods   Meal Ideas & Options:  . Breakfast:  o Whole wheat toast or whole wheat English muffins with peanut butter & hard boiled egg o Steel cut oats topped with apples & cinnamon and skim milk  o Fresh fruit: banana, strawberries, melon, berries, peaches  o Smoothies: strawberries, bananas, greek yogurt, peanut butter o Low fat greek yogurt with blueberries and granola  o Egg white omelet with spinach and mushrooms o Breakfast couscous: whole wheat couscous, apricots, skim milk, cranberries  . Sandwiches:  o Hummus and grilled vegetables (peppers, zucchini, squash) on whole wheat bread   o Grilled chicken on whole wheat pita with lettuce, tomatoes, cucumbers or tzatziki  o Tuna salad on whole wheat bread: tuna salad made with greek yogurt, olives, red peppers, capers, green onions o Garlic rosemary lamb pita: lamb sauted with garlic, rosemary, salt & pepper; add lettuce, cucumber, greek yogurt to pita - flavor with lemon juice and black pepper  . Seafood:  o Mediterranean grilled salmon, seasoned with garlic, basil, parsley, lemon juice and black pepper o Shrimp, lemon, and spinach whole-grain pasta salad made with low fat greek yogurt  o Seared scallops with lemon orzo  o Seared tuna steaks seasoned salt, pepper, coriander topped with tomato mixture of olives, tomatoes, olive oil, minced garlic, parsley, green onions and cappers  . Meats:  o Herbed greek chicken salad with kalamata olives, cucumber, feta  o Red bell peppers stuffed with spinach, bulgur, lean ground beef (or lentils) & topped with feta   o Kebabs: skewers of chicken, tomatoes, onions, zucchini, squash  o Turkey burgers: made with red onions, mint, dill, lemon juice, feta cheese topped with roasted red peppers . Vegetarian o Cucumber salad: cucumbers, artichoke hearts, celery, red onion, feta cheese, tossed in olive oil &  lemon juice  o Hummus and whole grain pita points with a greek salad (lettuce, tomato, feta, olives, cucumbers, red onion) o Lentil soup with celery, carrots made with vegetable broth, garlic, salt and pepper  o Tabouli salad: parsley, bulgur, mint, scallions, cucumbers, tomato, radishes, lemon juice, olive oil, salt and pepper.      

## 2018-08-08 LAB — TSH: TSH: 1 u[IU]/mL (ref 0.450–4.500)

## 2018-08-08 LAB — COMPREHENSIVE METABOLIC PANEL
ALT: 35 IU/L — ABNORMAL HIGH (ref 0–32)
AST: 37 IU/L (ref 0–40)
Albumin/Globulin Ratio: 1.7 (ref 1.2–2.2)
Albumin: 4 g/dL (ref 3.8–4.8)
Alkaline Phosphatase: 120 IU/L — ABNORMAL HIGH (ref 39–117)
BUN/Creatinine Ratio: 22 (ref 9–23)
BUN: 15 mg/dL (ref 6–24)
Bilirubin Total: 0.3 mg/dL (ref 0.0–1.2)
CO2: 24 mmol/L (ref 20–29)
Calcium: 9.1 mg/dL (ref 8.7–10.2)
Chloride: 99 mmol/L (ref 96–106)
Creatinine, Ser: 0.69 mg/dL (ref 0.57–1.00)
GFR calc Af Amer: 119 mL/min/{1.73_m2} (ref >59)
GFR calc non Af Amer: 103 mL/min/{1.73_m2} (ref >59)
Globulin, Total: 2.3 g/dL (ref 1.5–4.5)
Glucose: 87 mg/dL (ref 65–99)
Potassium: 3.9 mmol/L (ref 3.5–5.2)
Sodium: 138 mmol/L (ref 134–144)
Total Protein: 6.3 g/dL (ref 6.0–8.5)

## 2018-08-08 LAB — CBC
Hematocrit: 42.3 % (ref 34.0–46.6)
Hemoglobin: 13.9 g/dL (ref 11.1–15.9)
MCH: 29.3 pg (ref 26.6–33.0)
MCHC: 32.9 g/dL (ref 31.5–35.7)
MCV: 89 fL (ref 79–97)
Platelets: 279 10*3/uL (ref 150–450)
RBC: 4.75 x10E6/uL (ref 3.77–5.28)
RDW: 13 % (ref 11.7–15.4)
WBC: 10.7 10*3/uL (ref 3.4–10.8)

## 2018-08-08 LAB — LIPID PANEL
Chol/HDL Ratio: 3.1 ratio (ref 0.0–4.4)
Cholesterol, Total: 153 mg/dL (ref 100–199)
HDL: 49 mg/dL (ref >39)
LDL Calculated: 80 mg/dL (ref 0–99)
Triglycerides: 118 mg/dL (ref 0–149)
VLDL Cholesterol Cal: 24 mg/dL (ref 5–40)

## 2018-08-08 LAB — HEMOGLOBIN A1C
Est. average glucose Bld gHb Est-mCnc: 100 mg/dL
Hgb A1c MFr Bld: 5.1 % (ref 4.8–5.6)

## 2019-02-11 ENCOUNTER — Other Ambulatory Visit: Payer: Self-pay | Admitting: Registered Nurse

## 2019-02-11 DIAGNOSIS — I10 Essential (primary) hypertension: Secondary | ICD-10-CM

## 2019-02-11 NOTE — Telephone Encounter (Signed)
Pls schedule appt for her 6 month f/u and refill. No further refills without office visit.

## 2019-02-11 NOTE — Telephone Encounter (Signed)
Requested medication (s) are due for refill today: yes  Requested medication (s) are on the active medication list: yes  Last refill:  11/23/2018  Future visit scheduled: no  Notes to clinic:  LOV-08/07/2018 Review for refill   Requested Prescriptions  Pending Prescriptions Disp Refills   lisinopril-hydrochlorothiazide (ZESTORETIC) 20-25 MG tablet [Pharmacy Med Name: LISINOPRIL-HCTZ 20-25 MG TAB] 90 tablet 1    Sig: TAKE 1 TABLET BY MOUTH EVERY DAY      Cardiovascular:  ACEI + Diuretic Combos Failed - 02/11/2019  4:04 AM      Failed - Na in normal range and within 180 days    Sodium  Date Value Ref Range Status  08/07/2018 138 134 - 144 mmol/L Final          Failed - K in normal range and within 180 days    Potassium  Date Value Ref Range Status  08/07/2018 3.9 3.5 - 5.2 mmol/L Final          Failed - Cr in normal range and within 180 days    Creat  Date Value Ref Range Status  04/09/2014 0.66 0.50 - 1.10 mg/dL Final   Creatinine, Ser  Date Value Ref Range Status  08/07/2018 0.69 0.57 - 1.00 mg/dL Final          Failed - Ca in normal range and within 180 days    Calcium  Date Value Ref Range Status  08/07/2018 9.1 8.7 - 10.2 mg/dL Final          Failed - Valid encounter within last 6 months    Recent Outpatient Visits           6 months ago Screening for endocrine, metabolic and immunity disorder   [facility], Delfino Lovett, NP   1 year ago Essential hypertension   [facility] at [place], [place]   2 years ago Essential hypertension   Primary [facility] at [facility], [place], [place]   3 years ago Essential hypertension   [facility], Synetta Shadow, MD   4 years ago Neck pain on left side   [facility] at [place], [place]              Passed - Patient is not pregnant      Passed - Last BP in normal range    BP Readings from Last 1 Encounters:  08/07/18 128/82

## 2019-02-12 NOTE — Telephone Encounter (Signed)
Lvmtcb and schedule appt.

## 2019-03-01 DIAGNOSIS — Z20828 Contact with and (suspected) exposure to other viral communicable diseases: Secondary | ICD-10-CM | POA: Diagnosis not present

## 2019-03-10 ENCOUNTER — Other Ambulatory Visit: Payer: Self-pay | Admitting: Registered Nurse

## 2019-03-10 DIAGNOSIS — I10 Essential (primary) hypertension: Secondary | ICD-10-CM

## 2019-03-10 NOTE — Telephone Encounter (Signed)
Please schedule patient an appt to be seen for med refills

## 2019-03-12 NOTE — Telephone Encounter (Signed)
Called pt LVM for her to c/b and set up appt FR 

## 2019-03-12 NOTE — Telephone Encounter (Signed)
Called pt LVM for her to c/b and set up appt FR

## 2019-04-02 ENCOUNTER — Other Ambulatory Visit: Payer: Self-pay

## 2019-04-02 ENCOUNTER — Encounter: Payer: Self-pay | Admitting: Registered Nurse

## 2019-04-02 ENCOUNTER — Ambulatory Visit: Payer: BC Managed Care – PPO | Admitting: Registered Nurse

## 2019-04-02 DIAGNOSIS — I10 Essential (primary) hypertension: Secondary | ICD-10-CM | POA: Diagnosis not present

## 2019-04-02 MED ORDER — LISINOPRIL-HYDROCHLOROTHIAZIDE 20-25 MG PO TABS: 1.0000 | ORAL_TABLET | Freq: Every day | ORAL | 3 refills | Status: DC

## 2019-04-02 NOTE — Patient Instructions (Addendum)
  COVID-19 Vaccine Information can be found at: [email or web address] For questions related to vaccine distribution or appointments, please email [email or web address] or call [phone].     If you have lab work done today you will be contacted with your lab results within the next 2 weeks.  If you have not heard from us then please contact us. The fastest way to get your results is to register for My Chart.   IF you received an x-ray today, you will receive an invoice from [facility]. Please contact [facility] at [phone] with questions or concerns regarding your invoice.   IF you received labwork today, you will receive an invoice from [facility]. Please contact [facility] at [phone] with questions or concerns regarding your invoice.   Our billing staff will not be able to assist you with questions regarding bills from these companies.  You will be contacted with the lab results as soon as they are available. The fastest way to get your results is to activate your My Chart account. Instructions are located on the last page of this paperwork. If you have not heard from us regarding the results in 2 weeks, please contact this office.       

## 2019-04-06 ENCOUNTER — Encounter: Payer: Self-pay | Admitting: Registered Nurse

## 2019-04-06 NOTE — Progress Notes (Signed)
Established Patient Office Visit  Subjective:  Patient ID: Tammy Briggs, female    DOB: 1970-06-24  Age: 49 y.o. MRN: [id number]  CC:  Chief Complaint  Patient presents with  . Medication Refill    lisinopril-hydrochlorothiazide    HPI Tammy Briggs presents for refill on lisinopril-hctz.  Has been on this medication for some time without dose change. Feeling well. Denies CV symptoms including headache, shob, doe, visual changes, dependent edema, chest pain, and others. No other concerns at this time  BP well wnl today.   Past Medical History:  Diagnosis Date  . Constipation   . GERD (gastroesophageal reflux disease)    takes Nexium daily  . History of bronchitis 3-21yrs ago  . History of kidney stones   . Hypertension    takes Lisinopril-HCTZ daily  . Joint swelling    right   . Muscle spasm    takes Flexeril daily as needed  . Pneumonia hx of-9yr ago  . PONV (postoperative nausea and vomiting)   . Weakness    numbness and tingling left leg    Past Surgical History:  Procedure Laterality Date  . adeoinectomy    . CHOLECYSTECTOMY  1yrs ago  . HPV removed  age 80  . LUMBAR LAMINECTOMY/DECOMPRESSION MICRODISCECTOMY Left 10/05/2013   Procedure: LUMBAR LAMINECTOMY/DECOMPRESSION MICRODISCECTOMY 1 LEVEL L5-S1;  Surgeon: Faythe Ghee, MD;  Location: [facility];  Service: Neurosurgery;  Laterality: Left;  LUMBAR LAMINECTOMY/DECOMPRESSION MICRODISCECTOMY 1 LEVEL L5-S1  . MYRINGOTOMY     x 4    Family History  Problem Relation Age of Onset  . Hypertension Mother   . Hypertension Father   . Diabetes Father     Social History   Socioeconomic History  . Marital status: Married    Spouse name: Not on file  . Number of children: 2  . Years of education: Not on file  . Highest education level: Not on file  Occupational History  . Not on file  Tobacco Use  . Smoking status: Former Smoker    Packs/day: 0.50    Years: 15.00    Pack years: 7.50   Start date: 08/06/1997    Quit date: 08/06/2012    Years since quitting: 6.6  . Smokeless tobacco: Never Used  . Tobacco comment: quit smoking 03/22/13  Substance and Sexual Activity  . Alcohol use: No  . Drug use: No  . Sexual activity: Yes  Other Topics Concern  . Not on file  Social History Narrative  . Not on file   Social Determinants of Health   Financial Resource Strain: Low Risk   . Difficulty of Paying Living Expenses: Not hard at all  Food Insecurity: No Food Insecurity  . Worried About Charity fundraiser in the Last Year: Never true  . Ran Out of Food in the Last Year: Never true  Transportation Needs: No Transportation Needs  . Lack of Transportation (Medical): No  . Lack of Transportation (Non-Medical): No  Physical Activity: Inactive  . Days of Exercise per Week: 0 days  . Minutes of Exercise per Session: 0 min  Stress: No Stress Concern Present  . Feeling of Stress : Only a little  Social Connections: Slightly Isolated  . Frequency of Communication with Friends and Family: Three times a week  . Frequency of Social Gatherings with Friends and Family: Twice a week  . Attends Religious Services: 1 to 4 times per year  . Active Member of Clubs or Organizations:  No  . Attends Club or Organization Meetings: Never  . Marital Status: Married  Catering manager Violence: Not At Risk  . Fear of Current or Ex-Partner: No  . Emotionally Abused: No  . Physically Abused: No  . Sexually Abused: No    Outpatient Medications Prior to Visit  Medication Sig Dispense Refill  . lisinopril-hydrochlorothiazide (ZESTORETIC) 20-25 MG tablet TAKE 1 TABLET BY MOUTH EVERY DAY 30 tablet 0   No facility-administered medications prior to visit.    No Known Allergies  ROS Review of Systems  Constitutional: Negative.   HENT: Negative.   Eyes: Negative.   Respiratory: Negative.   Cardiovascular: Negative.   Gastrointestinal: Negative.   Endocrine: Negative.   Genitourinary:  Negative.   Musculoskeletal: Negative.   Skin: Negative.   Allergic/Immunologic: Negative.   Neurological: Negative.   Hematological: Negative.   Psychiatric/Behavioral: Negative.   All other systems reviewed and are negative.     Objective:    Physical Exam  Constitutional: She appears well-developed and well-nourished. No distress.  Cardiovascular: Normal rate and normal heart sounds. Exam reveals no gallop and no friction rub.  No murmur heard. Pulmonary/Chest: Effort normal and breath sounds normal. No respiratory distress. She has no wheezes. She has no rales. She exhibits no tenderness.  Neurological: She is alert.  Skin: Skin is warm and dry. No rash noted. She is not diaphoretic. No erythema. No pallor.  Psychiatric: She has a normal mood and affect. Her behavior is normal. Judgment and thought content normal.  Nursing note and vitals reviewed.   BP 124/81   Pulse 81   Temp (!) 97.3 F (36.3 C) (Temporal)   Ht 5\' 7"  (1.702 m)   Wt (!) 303 lb 12.8 oz (137.8 kg)   LMP 03/22/2019   SpO2 100%   BMI 47.58 kg/m  Wt Readings from Last 3 Encounters:  04/02/19 (!) 303 lb 12.8 oz (137.8 kg)  07/14/17 259 lb 12.8 oz (117.8 kg)  07/18/16 (!) 319 lb 6.4 oz (144.9 kg)     Health Maintenance Due  Topic Date Due  . PAP SMEAR-Modifier  07/27/2018    There are no preventive care reminders to display for this patient.  Lab Results  Component Value Date   TSH 1.000 08/07/2018   Lab Results  Component Value Date   WBC 10.7 08/07/2018   HGB 13.9 08/07/2018   HCT 42.3 08/07/2018   MCV 89 08/07/2018   PLT 279 08/07/2018   Lab Results  Component Value Date   NA 138 08/07/2018   K 3.9 08/07/2018   CO2 24 08/07/2018   GLUCOSE 87 08/07/2018   BUN 15 08/07/2018   CREATININE 0.69 08/07/2018   BILITOT 0.3 08/07/2018   ALKPHOS 120 (H) 08/07/2018   AST 37 08/07/2018   ALT 35 (H) 08/07/2018   PROT 6.3 08/07/2018   ALBUMIN 4.0 08/07/2018   CALCIUM 9.1 08/07/2018    ANIONGAP 14 09/28/2013   Lab Results  Component Value Date   CHOL 153 08/07/2018   Lab Results  Component Value Date   HDL 49 08/07/2018   Lab Results  Component Value Date   LDLCALC 80 08/07/2018   Lab Results  Component Value Date   TRIG 118 08/07/2018   Lab Results  Component Value Date   CHOLHDL 3.1 08/07/2018   Lab Results  Component Value Date   HGBA1C 5.1 08/07/2018      Assessment & Plan:   Problem List Items Addressed This Visit  None    Visit Diagnoses    Essential hypertension       Relevant Medications   lisinopril-hydrochlorothiazide (ZESTORETIC) 20-25 MG tablet      Meds ordered this encounter  Medications  . lisinopril-hydrochlorothiazide (ZESTORETIC) 20-25 MG tablet    Sig: Take 1 tablet by mouth daily.    Dispense:  90 tablet    Refill:  3    Order Specific Question:   Supervising Provider    Answer:   Doristine Bosworth [id number]    Follow-up: No follow-ups on file.   PLAN  Refill meds x 1 year  Suggest CPE at patient's preference  Pt will contact GYN for pap that is due  Patient encouraged to call clinic with any questions, comments, or concerns.  Janeece Agee, NP

## 2019-05-13 ENCOUNTER — Encounter: Payer: Self-pay | Admitting: Registered Nurse

## 2019-05-14 ENCOUNTER — Other Ambulatory Visit: Payer: Self-pay | Admitting: Registered Nurse

## 2019-05-14 DIAGNOSIS — J4520 Mild intermittent asthma, uncomplicated: Secondary | ICD-10-CM

## 2019-05-14 MED ORDER — ALBUTEROL SULFATE HFA 108 (90 BASE) MCG/ACT IN AERS: 2.0000 | INHALATION_SPRAY | Freq: Four times a day (QID) | RESPIRATORY_TRACT | 3 refills | Status: DC | PRN

## 2019-05-14 NOTE — Telephone Encounter (Signed)
Patient is asking can she get an inhaler she has not been on it in a few years and starting to have some attacks again in the past two weeks. I don't see it on her medication list or even the history.   Please Advise.

## 2020-03-01 DIAGNOSIS — H40023 Open angle with borderline findings, high risk, bilateral: Secondary | ICD-10-CM | POA: Diagnosis not present

## 2020-03-01 DIAGNOSIS — H5213 Myopia, bilateral: Secondary | ICD-10-CM | POA: Diagnosis not present

## 2020-03-01 DIAGNOSIS — H524 Presbyopia: Secondary | ICD-10-CM | POA: Diagnosis not present

## 2020-03-28 ENCOUNTER — Other Ambulatory Visit: Payer: Self-pay

## 2020-03-28 ENCOUNTER — Telehealth (INDEPENDENT_AMBULATORY_CARE_PROVIDER_SITE_OTHER): Payer: BC Managed Care – PPO | Admitting: Registered Nurse

## 2020-03-28 ENCOUNTER — Encounter: Payer: Self-pay | Admitting: Registered Nurse

## 2020-03-28 DIAGNOSIS — J3489 Other specified disorders of nose and nasal sinuses: Secondary | ICD-10-CM | POA: Diagnosis not present

## 2020-03-28 DIAGNOSIS — R52 Pain, unspecified: Secondary | ICD-10-CM

## 2020-03-28 DIAGNOSIS — I10 Essential (primary) hypertension: Secondary | ICD-10-CM | POA: Diagnosis not present

## 2020-03-28 DIAGNOSIS — R519 Headache, unspecified: Secondary | ICD-10-CM | POA: Diagnosis not present

## 2020-03-28 MED ORDER — LISINOPRIL-HYDROCHLOROTHIAZIDE 20-25 MG PO TABS: 1.0000 | ORAL_TABLET | Freq: Every day | ORAL | 3 refills | Status: DC

## 2020-03-28 MED ORDER — GUAIFENESIN-DM 100-10 MG/5ML PO SYRP: 5.0000 mL | ORAL_SOLUTION | ORAL | 0 refills | Status: DC | PRN

## 2020-03-28 MED ORDER — BENZONATATE 200 MG PO CAPS: 200.0000 mg | ORAL_CAPSULE | Freq: Two times a day (BID) | ORAL | 0 refills | Status: DC | PRN

## 2020-03-28 MED ORDER — AZELASTINE HCL 0.1 % NA SOLN: 1.0000 | Freq: Two times a day (BID) | NASAL | 12 refills | Status: DC

## 2020-03-28 NOTE — Patient Instructions (Signed)
° ° ° °  If you have lab work done today you will be contacted with your lab results within the next 2 weeks.  If you have not heard from us then please contact us. The fastest way to get your results is to register for My Chart. ° ° °IF you received an x-ray today, you will receive an invoice from [facility]. Please contact [facility] at [phone] with questions or concerns regarding your invoice.  ° °IF you received labwork today, you will receive an invoice from [facility]. Please contact [facility] at [phone] with questions or concerns regarding your invoice.  ° °Our billing staff will not be able to assist you with questions regarding bills from these companies. ° °You will be contacted with the lab results as soon as they are available. The fastest way to get your results is to activate your My Chart account. Instructions are located on the last page of this paperwork. If you have not heard from us regarding the results in 2 weeks, please contact this office. °  ° ° ° °

## 2020-03-28 NOTE — Progress Notes (Signed)
Telemedicine Encounter- SOAP NOTE Established Patient  This telephone encounter was conducted with the patient's (or proxy's) verbal consent via audio telecommunications: yes  Patient was instructed to have this encounter in a suitably private space; and to only have persons present to whom they give permission to participate. In addition, patient identity was confirmed by use of name plus two identifiers (DOB and address).  I discussed the limitations, risks, security and privacy concerns of performing an evaluation and management service by telephone and the availability of in person appointments. I also discussed with the patient that there may be a patient responsible charge related to this service. The patient expressed understanding and agreed to proceed.  I spent a total of 14 minutes talking with the patient or their proxy.  Patient at home Provider in office  Chief Complaint  Patient presents with  . Headache    Patient states she has been experiencing a headache, body aches , coughing, and some sinus issues for about 3 days. Per patient she has been taking OTC medications that has not seemed to help.    Subjective   Tammy Briggs is a 49 y.o. established patient. Telephone visit today for upper respiratory symptoms  HPI Having upper respiratory symptoms x 3 days Sinus pressure on and off since christmas, but now having pnd, body aches, headaches Has taken OTCs with limited effect Received JJ vaccine in March 2021, no booster No sick contacts to her knowledge No wheezing, shob, doe, chest pain No nvd No disturbance to senses  Otherwise no concerns.   Patient Active Problem List   Diagnosis Date Noted  . Morbid obesity (HCC) 06/10/2015  . Midline low back pain without sciatica 06/10/2015  . Herniated lumbar intervertebral disc 10/05/2013    Past Medical History:  Diagnosis Date  . Constipation   . GERD (gastroesophageal reflux disease)    takes Nexium  daily  . History of bronchitis 3-30yrs ago  . History of kidney stones   . Hypertension    takes Lisinopril-HCTZ daily  . Joint swelling    right   . Muscle spasm    takes Flexeril daily as needed  . Pneumonia hx of-38yr ago  . PONV (postoperative nausea and vomiting)   . Weakness    numbness and tingling left leg    Current Outpatient Medications  Medication Sig Dispense Refill  . albuterol (VENTOLIN HFA) 108 (90 Base) MCG/ACT inhaler Inhale 2 puffs into the lungs every 6 (six) hours as needed for wheezing or shortness of breath. 18 g 3  . lisinopril-hydrochlorothiazide (ZESTORETIC) 20-25 MG tablet Take 1 tablet by mouth daily. 90 tablet 3   No current facility-administered medications for this visit.    No Known Allergies  Social History   Socioeconomic History  . Marital status: Married    Spouse name: Not on file  . Number of children: 2  . Years of education: Not on file  . Highest education level: Not on file  Occupational History  . Not on file  Tobacco Use  . Smoking status: Former Smoker    Packs/day: 0.50    Years: 15.00    Pack years: 7.50    Start date: 08/06/1997    Quit date: 08/06/2012    Years since quitting: 7.6  . Smokeless tobacco: Never Used  . Tobacco comment: quit smoking 03/22/13  Vaping Use  . Vaping Use: Never used  Substance and Sexual Activity  . Alcohol use: No  . Drug use:  No  . Sexual activity: Yes  Other Topics Concern  . Not on file  Social History Narrative  . Not on file   Social Determinants of Health   Financial Resource Strain: Not on file  Food Insecurity: Not on file  Transportation Needs: Not on file  Physical Activity: Not on file  Stress: Not on file  Social Connections: Not on file  Intimate Partner Violence: Not on file    Review of Systems  Constitutional: Positive for malaise/fatigue.  HENT: Positive for congestion and sinus pain.   Eyes: Negative.   Respiratory: Positive for cough.   Cardiovascular:  Negative.   Gastrointestinal: Negative.   Genitourinary: Negative.   Musculoskeletal: Negative.   Skin: Negative.   Neurological: Positive for headaches.  Endo/Heme/Allergies: Negative.   Psychiatric/Behavioral: Negative.   All other systems reviewed and are negative.   Objective   Vitals as reported by the patient: There were no vitals filed for this visit.  [name] was seen today for headache.  Diagnoses and all orders for this visit:  Body aches -     guaiFENesin-dextromethorphan (ROBITUSSIN DM) 100-10 MG/5ML syrup; Take 5 mLs by mouth every 4 (four) hours as needed for cough. -     benzonatate (TESSALON) 200 MG capsule; Take 1 capsule (200 mg total) by mouth 2 (two) times daily as needed for cough. -     COVID-19, Flu A+B and RSV; Future  Essential hypertension -     lisinopril-hydrochlorothiazide (ZESTORETIC) 20-25 MG tablet; Take 1 tablet by mouth daily.  Nonintractable headache, unspecified chronicity pattern, unspecified headache type -     guaiFENesin-dextromethorphan (ROBITUSSIN DM) 100-10 MG/5ML syrup; Take 5 mLs by mouth every 4 (four) hours as needed for cough. -     benzonatate (TESSALON) 200 MG capsule; Take 1 capsule (200 mg total) by mouth 2 (two) times daily as needed for cough. -     COVID-19, Flu A+B and RSV; Future  Sinus pressure -     azelastine (ASTELIN) 0.1 % nasal spray; Place 1 spray into both nostrils 2 (two) times daily. Use in each nostril as directed -     guaiFENesin-dextromethorphan (ROBITUSSIN DM) 100-10 MG/5ML syrup; Take 5 mLs by mouth every 4 (four) hours as needed for cough. -     benzonatate (TESSALON) 200 MG capsule; Take 1 capsule (200 mg total) by mouth 2 (two) times daily as needed for cough. -     COVID-19, Flu A+B and RSV; Future   PLAN  Supportive care as above  Pt to present for COVID, flu, rsv swab testing  Will follow up on results as warranted  If no improvement by end of week may consider bacterial etiology given  duration of sinus pressure  Pt in agreement with plan  Patient encouraged to call clinic with any questions, comments, or concerns.   I discussed the assessment and treatment plan with the patient. The patient was provided an opportunity to ask questions and all were answered. The patient agreed with the plan and demonstrated an understanding of the instructions.   The patient was advised to call back or seek an in-person evaluation if the symptoms worsen or if the condition fails to improve as anticipated.  I provided 14 minutes of non-face-to-face time during this encounter.  Janeece Agee, NP  Primary Care at [facility]

## 2020-03-30 LAB — COVID-19, FLU A+B AND RSV
Influenza A, NAA: NOT DETECTED
Influenza B, NAA: NOT DETECTED
RSV, NAA: NOT DETECTED
SARS-CoV-2, NAA: DETECTED — AB

## 2020-04-08 ENCOUNTER — Encounter: Payer: Self-pay | Admitting: Registered Nurse

## 2020-04-10 NOTE — Telephone Encounter (Signed)
Pt reports green phlegm. Asking if abx is necessary?

## 2020-04-21 ENCOUNTER — Encounter: Payer: Self-pay | Admitting: Family Medicine

## 2020-04-21 ENCOUNTER — Telehealth: Payer: BC Managed Care – PPO | Admitting: Family Medicine

## 2020-04-21 DIAGNOSIS — U071 COVID-19: Secondary | ICD-10-CM

## 2020-04-21 DIAGNOSIS — R0989 Other specified symptoms and signs involving the circulatory and respiratory systems: Secondary | ICD-10-CM | POA: Diagnosis not present

## 2020-04-21 DIAGNOSIS — J4520 Mild intermittent asthma, uncomplicated: Secondary | ICD-10-CM

## 2020-04-21 DIAGNOSIS — R062 Wheezing: Secondary | ICD-10-CM

## 2020-04-21 DIAGNOSIS — R053 Chronic cough: Secondary | ICD-10-CM | POA: Diagnosis not present

## 2020-04-21 DIAGNOSIS — R0981 Nasal congestion: Secondary | ICD-10-CM

## 2020-04-21 MED ORDER — AZITHROMYCIN 250 MG PO TABS: ORAL_TABLET | ORAL | 0 refills | Status: DC

## 2020-04-21 MED ORDER — PREDNISONE 10 MG (21) PO TBPK: ORAL_TABLET | ORAL | 0 refills | Status: DC

## 2020-04-21 NOTE — Patient Instructions (Signed)
Continue supportive care for ongoing COVID-19 symptoms.  Improvement of symptoms varies per individual. Azithromycin 500 mg today and 250 mg on days 2 through 5 for symptoms of upper respiratory infection For exacerbation of asthma symptoms, prednisone taper Dosepak.  Utilize as directed.  If symptoms worsen, he may benefit from post Covid care center.  Please access [place] that for further information on ongoing COVID-19 symptoms treatment. Thank you for allowing us to be a part of your care.   Azithromycin tablets What is this medicine? AZITHROMYCIN (az ith roe MYE sin) is a macrolide antibiotic. It is used to treat or prevent certain kinds of bacterial infections. It will not work for colds, flu, or other viral infections. This medicine may be used for other purposes; ask your health care provider or pharmacist if you have questions. COMMON BRAND NAME(S): Zithromax, Zithromax Tri-Pak, Zithromax Z-Pak What should I tell my health care provider before I take this medicine? They need to know if you have any of these conditions:  history of blood diseases, like leukemia  history of irregular heartbeat  kidney disease  liver disease  myasthenia gravis  an unusual or allergic reaction to azithromycin, erythromycin, other macrolide antibiotics, foods, dyes, or preservatives  pregnant or trying to get pregnant  breast-feeding How should I use this medicine? Take this medicine by mouth with a full glass of water. Follow the directions on the prescription label. The tablets can be taken with food or on an empty stomach. If the medicine upsets your stomach, take it with food. Take your medicine at regular intervals. Do not take your medicine more often than directed. Take all of your medicine as directed even if you think your are better. Do not skip doses or stop your medicine early. Talk to your pediatrician regarding the use of this medicine in children. While this drug may be  prescribed for children as young as 6 months for selected conditions, precautions do apply. Overdosage: If you think you have taken too much of this medicine contact a poison control center or emergency room at once. NOTE: This medicine is only for you. Do not share this medicine with others. What if I miss a dose? If you miss a dose, take it as soon as you can. If it is almost time for your next dose, take only that dose. Do not take double or extra doses. What may interact with this medicine? Do not take this medicine with any of the following medications:  cisapride  dronedarone  pimozide  thioridazine This medicine may also interact with the following medications:  antacids that contain aluminum or magnesium  birth control pills  colchicine  cyclosporine  digoxin  ergot alkaloids like dihydroergotamine, ergotamine  nelfinavir  other medicines that prolong the QT interval (an abnormal heart rhythm)  phenytoin  warfarin This list may not describe all possible interactions. Give your health care provider a list of all the medicines, herbs, non-prescription drugs, or dietary supplements you use. Also tell them if you smoke, drink alcohol, or use illegal drugs. Some items may interact with your medicine. What should I watch for while using this medicine? Tell your doctor or healthcare provider if your symptoms do not start to get better or if they get worse. This medicine may cause serious skin reactions. They can happen weeks to months after starting the medicine. Contact your healthcare provider right away if you notice fevers or flu-like symptoms with a rash. The rash may be red or purple  and then turn into blisters or peeling of the skin. Or, you might notice a red rash with swelling of the face, lips or lymph nodes in your neck or under your arms. Do not treat diarrhea with over the counter products. Contact your doctor if you have diarrhea that lasts more than 2 days or  if it is severe and watery. This medicine can make you more sensitive to the sun. Keep out of the sun. If you cannot avoid being in the sun, wear protective clothing and use sunscreen. Do not use sun lamps or tanning beds/booths. What side effects may I notice from receiving this medicine? Side effects that you should report to your doctor or health care professional as soon as possible:  allergic reactions like skin rash, itching or hives, swelling of the face, lips, or tongue  bloody or watery diarrhea  breathing problems  chest pain  fast, irregular heartbeat  muscle weakness  rash, fever, and swollen lymph nodes  redness, blistering, peeling, or loosening of the skin, including inside the mouth  signs and symptoms of liver injury like dark yellow or brown urine; general ill feeling or flu-like symptoms; light-colored stools; loss of appetite; nausea; right upper belly pain; unusually weak or tired; yellowing of the eyes or skin  white patches or sores in the mouth  unusually weak or tired Side effects that usually do not require medical attention (report to your doctor or health care professional if they continue or are bothersome):  diarrhea  nausea  stomach pain  vomiting This list may not describe all possible side effects. Call your doctor for medical advice about side effects. You may report side effects to FDA at 1-800-FDA-1088. Where should I keep my medicine? Keep out of the reach of children. Store at room temperature between 15 and 30 degrees C (59 and 86 degrees F). Throw away any unused medicine after the expiration date. NOTE: This sheet is a summary. It may not cover all possible information. If you have questions about this medicine, talk to your doctor, pharmacist, or health care provider.  2021 Elsevier/Gold Standard (2018-05-21 17:19:20) Prednisone tablets What is this medicine? PREDNISONE (PRED ni sone) is a corticosteroid. It is commonly used to  treat inflammation of the skin, joints, lungs, and other organs. Common conditions treated include asthma, allergies, and arthritis. It is also used for other conditions, such as blood disorders and diseases of the adrenal glands. This medicine may be used for other purposes; ask your health care provider or pharmacist if you have questions. COMMON BRAND NAME(S): Deltasone, Predone, Sterapred, Sterapred DS What should I tell my health care provider before I take this medicine? They need to know if you have any of these conditions:  Cushing's syndrome  diabetes  glaucoma  heart disease  high blood pressure  infection (especially a virus infection such as chickenpox, cold sores, or herpes)  kidney disease  liver disease  mental illness  myasthenia gravis  osteoporosis  seizures  stomach or intestine problems  thyroid disease  an unusual or allergic reaction to lactose, prednisone, other medicines, foods, dyes, or preservatives  pregnant or trying to get pregnant  breast-feeding How should I use this medicine? Take this medicine by mouth with a glass of water. Follow the directions on the prescription label. Take this medicine with food. If you are taking this medicine once a day, take it in the morning. Do not take more medicine than you are told to take. Do not suddenly  stop taking your medicine because you may develop a severe reaction. Your doctor will tell you how much medicine to take. If your doctor wants you to stop the medicine, the dose may be slowly lowered over time to avoid any side effects. Talk to your pediatrician regarding the use of this medicine in children. Special care may be needed. Overdosage: If you think you have taken too much of this medicine contact a poison control center or emergency room at once. NOTE: This medicine is only for you. Do not share this medicine with others. What if I miss a dose? If you miss a dose, take it as soon as you can. If  it is almost time for your next dose, talk to your doctor or health care professional. You may need to miss a dose or take an extra dose. Do not take double or extra doses without advice. What may interact with this medicine? Do not take this medicine with any of the following medications:  metyrapone  mifepristone This medicine may also interact with the following medications:  aminoglutethimide  amphotericin B  aspirin and aspirin-like medicines  barbiturates  certain medicines for diabetes, like glipizide or glyburide  cholestyramine  cholinesterase inhibitors  cyclosporine  digoxin  diuretics  ephedrine  female hormones, like estrogens and birth control pills  isoniazid  ketoconazole  NSAIDS, medicines for pain and inflammation, like ibuprofen or naproxen  phenytoin  rifampin  toxoids  vaccines  warfarin This list may not describe all possible interactions. Give your health care provider a list of all the medicines, herbs, non-prescription drugs, or dietary supplements you use. Also tell them if you smoke, drink alcohol, or use illegal drugs. Some items may interact with your medicine. What should I watch for while using this medicine? Visit your doctor or health care professional for regular checks on your progress. If you are taking this medicine over a prolonged period, carry an identification card with your name and address, the type and dose of your medicine, and your doctor's name and address. This medicine may increase your risk of getting an infection. Tell your doctor or health care professional if you are around anyone with measles or chickenpox, or if you develop sores or blisters that do not heal properly. If you are going to have surgery, tell your doctor or health care professional that you have taken this medicine within the last twelve months. Ask your doctor or health care professional about your diet. You may need to lower the amount of salt  you eat. This medicine may increase blood sugar. Ask your healthcare provider if changes in diet or medicines are needed if you have diabetes. What side effects may I notice from receiving this medicine? Side effects that you should report to your doctor or health care professional as soon as possible:  allergic reactions like skin rash, itching or hives, swelling of the face, lips, or tongue  changes in emotions or moods  changes in vision  depressed mood  eye pain  fever or chills, cough, sore throat, pain or difficulty passing urine  signs and symptoms of high blood sugar such as being more thirsty or hungry or having to urinate more than normal. You may also feel very tired or have blurry vision.  swelling of ankles, feet Side effects that usually do not require medical attention (report to your doctor or health care professional if they continue or are bothersome):  confusion, excitement, restlessness  headache  nausea, vomiting  skin  problems, acne, thin and shiny skin  trouble sleeping  weight gain This list may not describe all possible side effects. Call your doctor for medical advice about side effects. You may report side effects to FDA at 1-800-FDA-1088. Where should I keep my medicine? Keep out of the reach of children. Store at room temperature between 15 and 30 degrees C (59 and 86 degrees F). Protect from light. Keep container tightly closed. Throw away any unused medicine after the expiration date. NOTE: This sheet is a summary. It may not cover all possible information. If you have questions about this medicine, talk to your doctor, pharmacist, or health care provider.  2021 Elsevier/Gold Standard (2017-11-11 10:54:22)

## 2020-04-21 NOTE — Progress Notes (Signed)
Ms. amori, cooperman are scheduled for a virtual visit with your provider today.    Just as we do with appointments in the office, we must obtain your consent to participate.  Your consent will be active for this visit and any virtual visit you may have with one of our providers in the next 365 days.    If you have a MyChart account, I can also send a copy of this consent to you electronically.  All virtual visits are billed to your insurance company just like a traditional visit in the office.  As this is a virtual visit, video technology does not allow for your provider to perform a traditional examination.  This may limit your provider's ability to fully assess your condition.  If your provider identifies any concerns that need to be evaluated in person or the need to arrange testing such as labs, EKG, etc, we will make arrangements to do so.    Although advances in technology are sophisticated, we cannot ensure that it will always work on either your end or our end.  If the connection with a video visit is poor, we may have to switch to a telephone visit.  With either a video or telephone visit, we are not always able to ensure that we have a secure connection.   I need to obtain your verbal consent now.   Are you willing to proceed with your visit today?   ZORIYAH SCHEIDEGGER has provided verbal consent on 04/21/2020 for a virtual visit (video or telephone).  Virtual Visit via Video Note  I connected with ROSEALYN LITTLE on 04/21/20 at 12:45 PM EST by a video enabled telemedicine application and verified that I am speaking with the correct person using two identifiers.  Location: Patient: Home Provider: [place] [facility]   I discussed the limitations of evaluation and management by telemedicine and the availability of in person appointments. The patient expressed understanding and agreed to proceed.  History of Present Illness: Ahni Bradwell is a 50 year old female with a  medical history significant for mild intermittent asthma, hypertension, and obesity presents via video with complaints prolonged COVID-19 symptoms.  Patient was diagnosed with COVID-19 on 03/25/2020.  Patient states that she has had wheezing, persistent cough, nasal congestion, and fatigue over the past several weeks.  Patient was seen by primary care provider for the symptoms several weeks ago and has been utilizing inhaler and Tessalon Perles without sustained relief.  Patient states that she has been relying on inhaler and using around every 4 hours for persistent cough and wheezing.  She says that symptoms are not improving despite treatment.  She denies headache, dizziness, shortness of breath, urinary symptoms, nausea, vomiting, or diarrhea.  Patient is a non-smoker and has been fully vaccinated against COVID-19. Asthma She complains of cough, frequent throat clearing, sputum production and wheezing. There is no chest tightness, difficulty breathing, hemoptysis or shortness of breath. This is a recurrent problem. The current episode started more than 1 month ago. The problem occurs constantly. The problem has been unchanged. The cough is productive of sputum and productive. Associated symptoms include malaise/fatigue and nasal congestion. Pertinent negatives include no appetite change, chest pain, ear congestion, ear pain, fever, sore throat, sweats or trouble swallowing. Her symptoms are alleviated by rest, prescription cough suppressant and OTC inhaler. She reports minimal improvement on treatment. Her past medical history is significant for asthma.    Past Medical History:  Diagnosis Date  . Constipation   .  GERD (gastroesophageal reflux disease)    takes Nexium daily  . History of bronchitis 3-76yrs ago  . History of kidney stones   . Hypertension    takes Lisinopril-HCTZ daily  . Joint swelling    right   . Muscle spasm    takes Flexeril daily as needed  . Pneumonia hx of-23yr ago  . PONV  (postoperative nausea and vomiting)   . Weakness    numbness and tingling left leg   Social History   Socioeconomic History  . Marital status: Married    Spouse name: Not on file  . Number of children: 2  . Years of education: Not on file  . Highest education level: Not on file  Occupational History  . Not on file  Tobacco Use  . Smoking status: Former Smoker    Packs/day: 0.50    Years: 15.00    Pack years: 7.50    Start date: 08/06/1997    Quit date: 08/06/2012    Years since quitting: 7.7  . Smokeless tobacco: Never Used  . Tobacco comment: quit smoking 03/22/13  Vaping Use  . Vaping Use: Never used  Substance and Sexual Activity  . Alcohol use: No  . Drug use: No  . Sexual activity: Yes  Other Topics Concern  . Not on file  Social History Narrative  . Not on file   Social Determinants of Health   Financial Resource Strain: Not on file  Food Insecurity: Not on file  Transportation Needs: Not on file  Physical Activity: Not on file  Stress: Not on file  Social Connections: Not on file  Intimate Partner Violence: Not on file   No Known Allergies Immunization History  Administered Date(s) Administered  . Influenza,inj,quad, With Preservative 12/18/2016  . Janssen (J&J) SARS-COV-2 Vaccination 05/19/2019  . Tdap 08/07/2018   Review of Systems  Constitutional: Positive for malaise/fatigue. Negative for appetite change, chills and fever.  HENT: Positive for congestion. Negative for ear discharge, ear pain, sore throat and trouble swallowing.   Eyes: Negative.   Respiratory: Positive for cough, sputum production and wheezing. Negative for hemoptysis and shortness of breath.   Cardiovascular: Negative.  Negative for chest pain.  Gastrointestinal: Negative.   Genitourinary: Negative.   Musculoskeletal: Negative.   Skin: Negative.   Neurological: Negative.   Psychiatric/Behavioral: Negative.       Assessment and Plan: 1. COVID-19 Continue supportive care for  COVID-19.  Patient is no longer in window of infectivity, but continues to have worsening symptoms.  2. Symptoms of upper respiratory infection (URI) Increase rest, fluid intake, and handwashing. - azithromycin (ZITHROMAX) 250 MG tablet; Take 500 mg today and 250 mg on days 2-5  Dispense: 6 tablet; Refill: 0  3. Persistent cough for 3 weeks or longer - predniSONE (STERAPRED UNI-PAK 21 TAB) 10 MG (21) TBPK tablet; Tapered steroid pack as directed.  Dispense: 21 tablet; Refill: 0  4. Mild intermittent asthma without complication - predniSONE (STERAPRED UNI-PAK 21 TAB) 10 MG (21) TBPK tablet; Tapered steroid pack as directed.  Dispense: 21 tablet; Refill: 0  5. Nasal congestion Recommend nasal saline over-the-counter as directed.  6. Wheezing Continue albuterol inhaler every 4 hours as needed for wheezing, shortness of breath, and/or persistent cough.  Follow Up Instructions:    I discussed the assessment and treatment plan with the patient. The patient was provided an opportunity to ask questions and all were answered. The patient agreed with the plan and demonstrated an understanding of the instructions.  The patient was advised to call back or seek an in-person evaluation if the symptoms worsen or if the condition fails to improve as anticipated.  I provided 7 minutes of non-face-to-face time during this encounter.   Nolon Nations  APRN, MSN, FNP-C Patient Care [facility] [address] [place], [place] [address] [phone]  04/21/2020  12:46 PM

## 2020-05-01 NOTE — Telephone Encounter (Signed)
Please advise 

## 2020-05-05 ENCOUNTER — Ambulatory Visit
Admission: RE | Admit: 2020-05-05 | Discharge: 2020-05-05 | Disposition: A | Discharge status: Home / Self Care | Payer: BC Managed Care – PPO | Source: Ambulatory Visit | Attending: Registered Nurse | Admitting: Registered Nurse

## 2020-05-05 ENCOUNTER — Other Ambulatory Visit: Payer: Self-pay

## 2020-05-05 ENCOUNTER — Encounter: Payer: Self-pay | Admitting: Registered Nurse

## 2020-05-05 ENCOUNTER — Ambulatory Visit: Payer: BC Managed Care – PPO | Admitting: Registered Nurse

## 2020-05-05 VITALS — BP 134/84 | HR 100 | Temp 98.0°F | Resp 18 | Ht 67.0 in | Wt 318.2 lb

## 2020-05-05 DIAGNOSIS — R062 Wheezing: Secondary | ICD-10-CM | POA: Diagnosis not present

## 2020-05-05 DIAGNOSIS — J302 Other seasonal allergic rhinitis: Secondary | ICD-10-CM

## 2020-05-05 DIAGNOSIS — R059 Cough, unspecified: Secondary | ICD-10-CM

## 2020-05-05 DIAGNOSIS — Z8616 Personal history of COVID-19: Secondary | ICD-10-CM | POA: Diagnosis not present

## 2020-05-05 DIAGNOSIS — K219 Gastro-esophageal reflux disease without esophagitis: Secondary | ICD-10-CM

## 2020-05-05 MED ORDER — LEVOFLOXACIN 500 MG PO TABS: 500.0000 mg | ORAL_TABLET | Freq: Every day | ORAL | 0 refills | Status: DC

## 2020-05-05 MED ORDER — METHYLPREDNISOLONE ACETATE 40 MG/ML IJ SUSP
40.0000 mg | Freq: Once | INTRAMUSCULAR | Status: AC
  Administered 2020-05-05: 40 mg via INTRAMUSCULAR

## 2020-05-05 MED ORDER — QVAR REDIHALER 80 MCG/ACT IN AERB: 1.0000 | INHALATION_SPRAY | Freq: Two times a day (BID) | RESPIRATORY_TRACT | 3 refills | Status: DC

## 2020-05-05 MED ORDER — PANTOPRAZOLE SODIUM 40 MG PO TBEC: 40.0000 mg | DELAYED_RELEASE_TABLET | Freq: Every day | ORAL | 3 refills | Status: DC

## 2020-05-05 MED ORDER — CETIRIZINE HCL 10 MG PO TABS: 10.0000 mg | ORAL_TABLET | Freq: Every day | ORAL | 11 refills | Status: DC

## 2020-05-05 MED ORDER — METHYLPREDNISOLONE ACETATE 80 MG/ML IJ SUSP
80.0000 mg | Freq: Once | INTRAMUSCULAR | Status: DC
Start: 2020-05-05 — End: 2020-05-05

## 2020-05-05 NOTE — Patient Instructions (Signed)
° ° ° °  If you have lab work done today you will be contacted with your lab results within the next 2 weeks.  If you have not heard from us then please contact us. The fastest way to get your results is to register for My Chart. ° ° °IF you received an x-ray today, you will receive an invoice from [facility]. Please contact [facility] at [phone] with questions or concerns regarding your invoice.  ° °IF you received labwork today, you will receive an invoice from [facility]. Please contact [facility] at [phone] with questions or concerns regarding your invoice.  ° °Our billing staff will not be able to assist you with questions regarding bills from these companies. ° °You will be contacted with the lab results as soon as they are available. The fastest way to get your results is to activate your My Chart account. Instructions are located on the last page of this paperwork. If you have not heard from us regarding the results in 2 weeks, please contact this office. °  ° ° ° °

## 2020-05-05 NOTE — Progress Notes (Signed)
Acute Office Visit  Subjective:    Patient ID: Tammy Briggs, female    DOB: 01-26-1971, 50 y.o.   MRN: [id number]  Chief Complaint  Patient presents with  . Cough    Patient states she tested positive for covid at the end of January. Patient had Zpack and antibiotic. Patient states the coughing and wheezing has got worse over time.     HPI Patient is in today for cough  Had covid at end of January. Improved, then worsened again. Suspected post covid PNA, was given z pack and prednisone. Experienced some quick improvement but unfortunately following end of these courses quickly declined again.   Has been using albuterol around the clock, more frequently than prescribed at night sometimes due to coughing. Taking omeprazole daily. Using some OTCs as well. Nothing seems to help. Audible wheezing at times. shob with coughing. Has coughed to the point of vomiting a number of times - bilious vomiting, no coffee ground emesis Cough ranges from dry to productive with clear, yellow, and sometimes brownish mucus.  No cyanosis or LOC. No palpitations No GI changes.  Hx of seasonal allergies, never to this extent though.   Past Medical History:  Diagnosis Date  . Constipation   . GERD (gastroesophageal reflux disease)    takes Nexium daily  . History of bronchitis 3-59yrs ago  . History of kidney stones   . Hypertension    takes Lisinopril-HCTZ daily  . Joint swelling    right   . Muscle spasm    takes Flexeril daily as needed  . Pneumonia hx of-39yr ago  . PONV (postoperative nausea and vomiting)   . Weakness    numbness and tingling left leg    Past Surgical History:  Procedure Laterality Date  . adeoinectomy    . CHOLECYSTECTOMY  68yrs ago  . 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 LUMBAR LAMINECTOMY/DECOMPRESSION MICRODISCECTOMY Left 10/05/2013   Procedure: LUMBAR LAMINECTOMY/DECOMPRESSION MICRODISCECTOMY 1 LEVEL L5-S1;  Surgeon: Reinaldo Meeker, MD;  Location: [facility];  Service:  Neurosurgery;  Laterality: Left;  LUMBAR LAMINECTOMY/DECOMPRESSION MICRODISCECTOMY 1 LEVEL L5-S1  . MYRINGOTOMY     x 4    Family History  Problem Relation Age of Onset  . Hypertension Mother   . Hypertension Father   . Diabetes Father     Social History   Socioeconomic History  . Marital status: Married    Spouse name: Not on file  . Number of children: 2  . Years of education: Not on file  . Highest education level: Not on file  Occupational History  . Not on file  Tobacco Use  . Smoking status: Former Smoker    Packs/day: 0.50    Years: 15.00    Pack years: 7.50    Start date: 08/06/1997    Quit date: 08/06/2012    Years since quitting: 7.7  . Smokeless tobacco: Never Used  . Tobacco comment: quit smoking 03/22/13  Vaping Use  . Vaping Use: Never used  Substance and Sexual Activity  . Alcohol use: No  . Drug use: No  . Sexual activity: Yes  Other Topics Concern  . Not on file  Social History Narrative  . Not on file   Social Determinants of Health   Financial Resource Strain: Not on file  Food Insecurity: Not on file  Transportation Needs: Not on file  Physical Activity: Not on file  Stress: Not on file  Social Connections: Not on file  Intimate  Partner Violence: Not on file    Outpatient Medications Prior to Visit  Medication Sig Dispense Refill  . albuterol (VENTOLIN HFA) 108 (90 Base) MCG/ACT inhaler Inhale 2 puffs into the lungs every 6 (six) hours as needed for wheezing or shortness of breath. 18 g 3  . azelastine (ASTELIN) 0.1 % nasal spray Place 1 spray into both nostrils 2 (two) times daily. Use in each nostril as directed 30 mL 12  . benzonatate (TESSALON) 200 MG capsule Take 1 capsule (200 mg total) by mouth 2 (two) times daily as needed for cough. 20 capsule 0  . guaiFENesin-dextromethorphan (ROBITUSSIN DM) 100-10 MG/5ML syrup Take 5 mLs by mouth every 4 (four) hours as needed for cough. 118 mL 0  . lisinopril-hydrochlorothiazide (ZESTORETIC)  20-25 MG tablet Take 1 tablet by mouth daily. 90 tablet 3  . azithromycin (ZITHROMAX) 250 MG tablet Take 500 mg today and 250 mg on days 2-5 (Patient not taking: Reported on 05/05/2020) 6 tablet 0  . predniSONE (STERAPRED UNI-PAK 21 TAB) 10 MG (21) TBPK tablet Tapered steroid pack as directed. (Patient not taking: Reported on 05/05/2020) 21 tablet 0   No facility-administered medications prior to visit.    No Known Allergies  Review of Systems  Constitutional: Negative.   HENT: Negative.   Eyes: Negative.   Respiratory: Negative.   Cardiovascular: Negative.   Gastrointestinal: Negative.   Genitourinary: Negative.   Musculoskeletal: Negative.   Skin: Negative.   Neurological: Negative.   Psychiatric/Behavioral: Negative.   All other systems reviewed and are negative.      Objective:    Physical Exam Vitals and nursing note reviewed.  Constitutional:      General: She is not in acute distress.    Appearance: Normal appearance. She is not ill-appearing, toxic-appearing or diaphoretic.  HENT:     Head: Normocephalic and atraumatic.  Cardiovascular:     Rate and Rhythm: Normal rate and regular rhythm.     Pulses: Normal pulses.     Heart sounds: Normal heart sounds. No murmur heard. No friction rub. No gallop.   Pulmonary:     Effort: Pulmonary effort is normal. No respiratory distress.     Breath sounds: No stridor. Wheezing present. No rhonchi or rales.  Chest:     Chest wall: No tenderness.  Skin:    General: Skin is warm and dry.     Capillary Refill: Capillary refill takes less than 2 seconds.  Neurological:     General: No focal deficit present.     Mental Status: She is alert and oriented to person, place, and time. Mental status is at baseline.  Psychiatric:        Mood and Affect: Mood normal.        Behavior: Behavior normal.        Thought Content: Thought content normal.        Judgment: Judgment normal.     BP 134/84   Pulse 100   Temp 98 F (36.7 C)  (Temporal)   Resp 18   Ht 5\' 7"  (1.702 m)   Wt (!) 318 lb 3.2 oz (144.3 kg)   SpO2 96%   BMI 49.84 kg/m  Wt Readings from Last 3 Encounters:  05/05/20 (!) 318 lb 3.2 oz (144.3 kg)  04/02/19 (!) 303 lb 12.8 oz (137.8 kg)  07/14/17 259 lb 12.8 oz (117.8 kg)    There are no preventive care reminders to display for this patient.  There are no preventive care reminders  to display for this patient.   Lab Results  Component Value Date   TSH 1.000 08/07/2018   Lab Results  Component Value Date   WBC 10.7 08/07/2018   HGB 13.9 08/07/2018   HCT 42.3 08/07/2018   MCV 89 08/07/2018   PLT 279 08/07/2018   Lab Results  Component Value Date   NA 138 08/07/2018   K 3.9 08/07/2018   CO2 24 08/07/2018   GLUCOSE 87 08/07/2018   BUN 15 08/07/2018   CREATININE 0.69 08/07/2018   BILITOT 0.3 08/07/2018   ALKPHOS 120 (H) 08/07/2018   AST 37 08/07/2018   ALT 35 (H) 08/07/2018   PROT 6.3 08/07/2018   ALBUMIN 4.0 08/07/2018   CALCIUM 9.1 08/07/2018   ANIONGAP 14 09/28/2013   Lab Results  Component Value Date   CHOL 153 08/07/2018   Lab Results  Component Value Date   HDL 49 08/07/2018   Lab Results  Component Value Date   LDLCALC 80 08/07/2018   Lab Results  Component Value Date   TRIG 118 08/07/2018   Lab Results  Component Value Date   CHOLHDL 3.1 08/07/2018   Lab Results  Component Value Date   HGBA1C 5.1 08/07/2018       Assessment & Plan:   Problem List Items Addressed This Visit   None   Visit Diagnoses    Wheezing    -  Primary   Relevant Medications   levofloxacin (LEVAQUIN) 500 MG tablet   beclomethasone (QVAR REDIHALER) 80 MCG/ACT inhaler   cetirizine (ZYRTEC) 10 MG tablet   Other Relevant Orders   DG Chest 2 View   Seasonal allergies       Relevant Medications   cetirizine (ZYRTEC) 10 MG tablet   Other Relevant Orders   DG Chest 2 View   History of COVID-19       Relevant Medications   cetirizine (ZYRTEC) 10 MG tablet   methylPREDNISolone  acetate (DEPO-MEDROL) injection 40 mg (Completed) (Start on 05/05/2020 10:00 AM)   Other Relevant Orders   DG Chest 2 View   Cough       Relevant Medications   levofloxacin (LEVAQUIN) 500 MG tablet   methylPREDNISolone acetate (DEPO-MEDROL) injection 40 mg (Completed) (Start on 05/05/2020 10:00 AM)   Other Relevant Orders   DG Chest 2 View       No orders of the defined types were placed in this encounter.  PLAN  Pursue dg chest today - will follow up as warranted, but plan for treatment likely to remain the same  Depo medrol 40mg  IM in office  Levaquin 500mg  PO qd for 7 days  Qvar inhaler  Continue albuterol for breakthrough symptoms  Pantoprazole 40mg  PO qd  Return prn. Will consider pulmonary or post-covid care for ongoing symptoms  Patient encouraged to call clinic with any questions, comments, or concerns.   , NP

## 2020-05-09 ENCOUNTER — Encounter: Payer: Self-pay | Admitting: *Deleted

## 2020-05-16 ENCOUNTER — Other Ambulatory Visit: Payer: Self-pay | Admitting: Registered Nurse

## 2020-05-16 DIAGNOSIS — J4520 Mild intermittent asthma, uncomplicated: Secondary | ICD-10-CM

## 2020-08-04 ENCOUNTER — Other Ambulatory Visit: Payer: Self-pay | Admitting: Registered Nurse

## 2020-08-04 DIAGNOSIS — K219 Gastro-esophageal reflux disease without esophagitis: Secondary | ICD-10-CM

## 2020-08-25 ENCOUNTER — Other Ambulatory Visit: Payer: Self-pay | Admitting: Registered Nurse

## 2020-08-25 DIAGNOSIS — R062 Wheezing: Secondary | ICD-10-CM

## 2021-02-04 ENCOUNTER — Other Ambulatory Visit: Payer: Self-pay | Admitting: Registered Nurse

## 2021-02-04 DIAGNOSIS — K219 Gastro-esophageal reflux disease without esophagitis: Secondary | ICD-10-CM

## 2021-03-10 ENCOUNTER — Other Ambulatory Visit: Payer: Self-pay | Admitting: Registered Nurse

## 2021-03-10 DIAGNOSIS — I10 Essential (primary) hypertension: Secondary | ICD-10-CM

## 2021-03-12 NOTE — Telephone Encounter (Signed)
No labs since 2020. Last OV was 04/2020. OK to refill?

## 2021-03-28 ENCOUNTER — Other Ambulatory Visit: Payer: Self-pay | Admitting: Registered Nurse

## 2021-03-28 DIAGNOSIS — Z8616 Personal history of COVID-19: Secondary | ICD-10-CM

## 2021-03-28 DIAGNOSIS — R062 Wheezing: Secondary | ICD-10-CM

## 2021-03-28 DIAGNOSIS — J302 Other seasonal allergic rhinitis: Secondary | ICD-10-CM

## 2021-04-09 ENCOUNTER — Other Ambulatory Visit: Payer: Self-pay | Admitting: Registered Nurse

## 2021-04-09 DIAGNOSIS — R062 Wheezing: Secondary | ICD-10-CM

## 2021-06-06 ENCOUNTER — Other Ambulatory Visit: Payer: Self-pay | Admitting: Registered Nurse

## 2021-06-06 DIAGNOSIS — K219 Gastro-esophageal reflux disease without esophagitis: Secondary | ICD-10-CM

## 2021-09-03 DIAGNOSIS — D23112 Other benign neoplasm of skin of right lower eyelid, including canthus: Secondary | ICD-10-CM | POA: Diagnosis not present

## 2021-09-03 DIAGNOSIS — D23122 Other benign neoplasm of skin of left lower eyelid, including canthus: Secondary | ICD-10-CM | POA: Diagnosis not present

## 2021-09-03 DIAGNOSIS — H40023 Open angle with borderline findings, high risk, bilateral: Secondary | ICD-10-CM | POA: Diagnosis not present

## 2021-12-07 DIAGNOSIS — H40023 Open angle with borderline findings, high risk, bilateral: Secondary | ICD-10-CM | POA: Diagnosis not present

## 2021-12-31 IMAGING — CR DG CHEST 2V
2 series · 2 of 2 positions shown · non-contrast
Comparison: Chest radiographs 09/28/2013 and earlier.

CLINICAL DATA: 50-year-old female with cough and wheezing status
post 2A5N4-A2.

EXAM:
CHEST - 2 VIEW

[w chest pa]
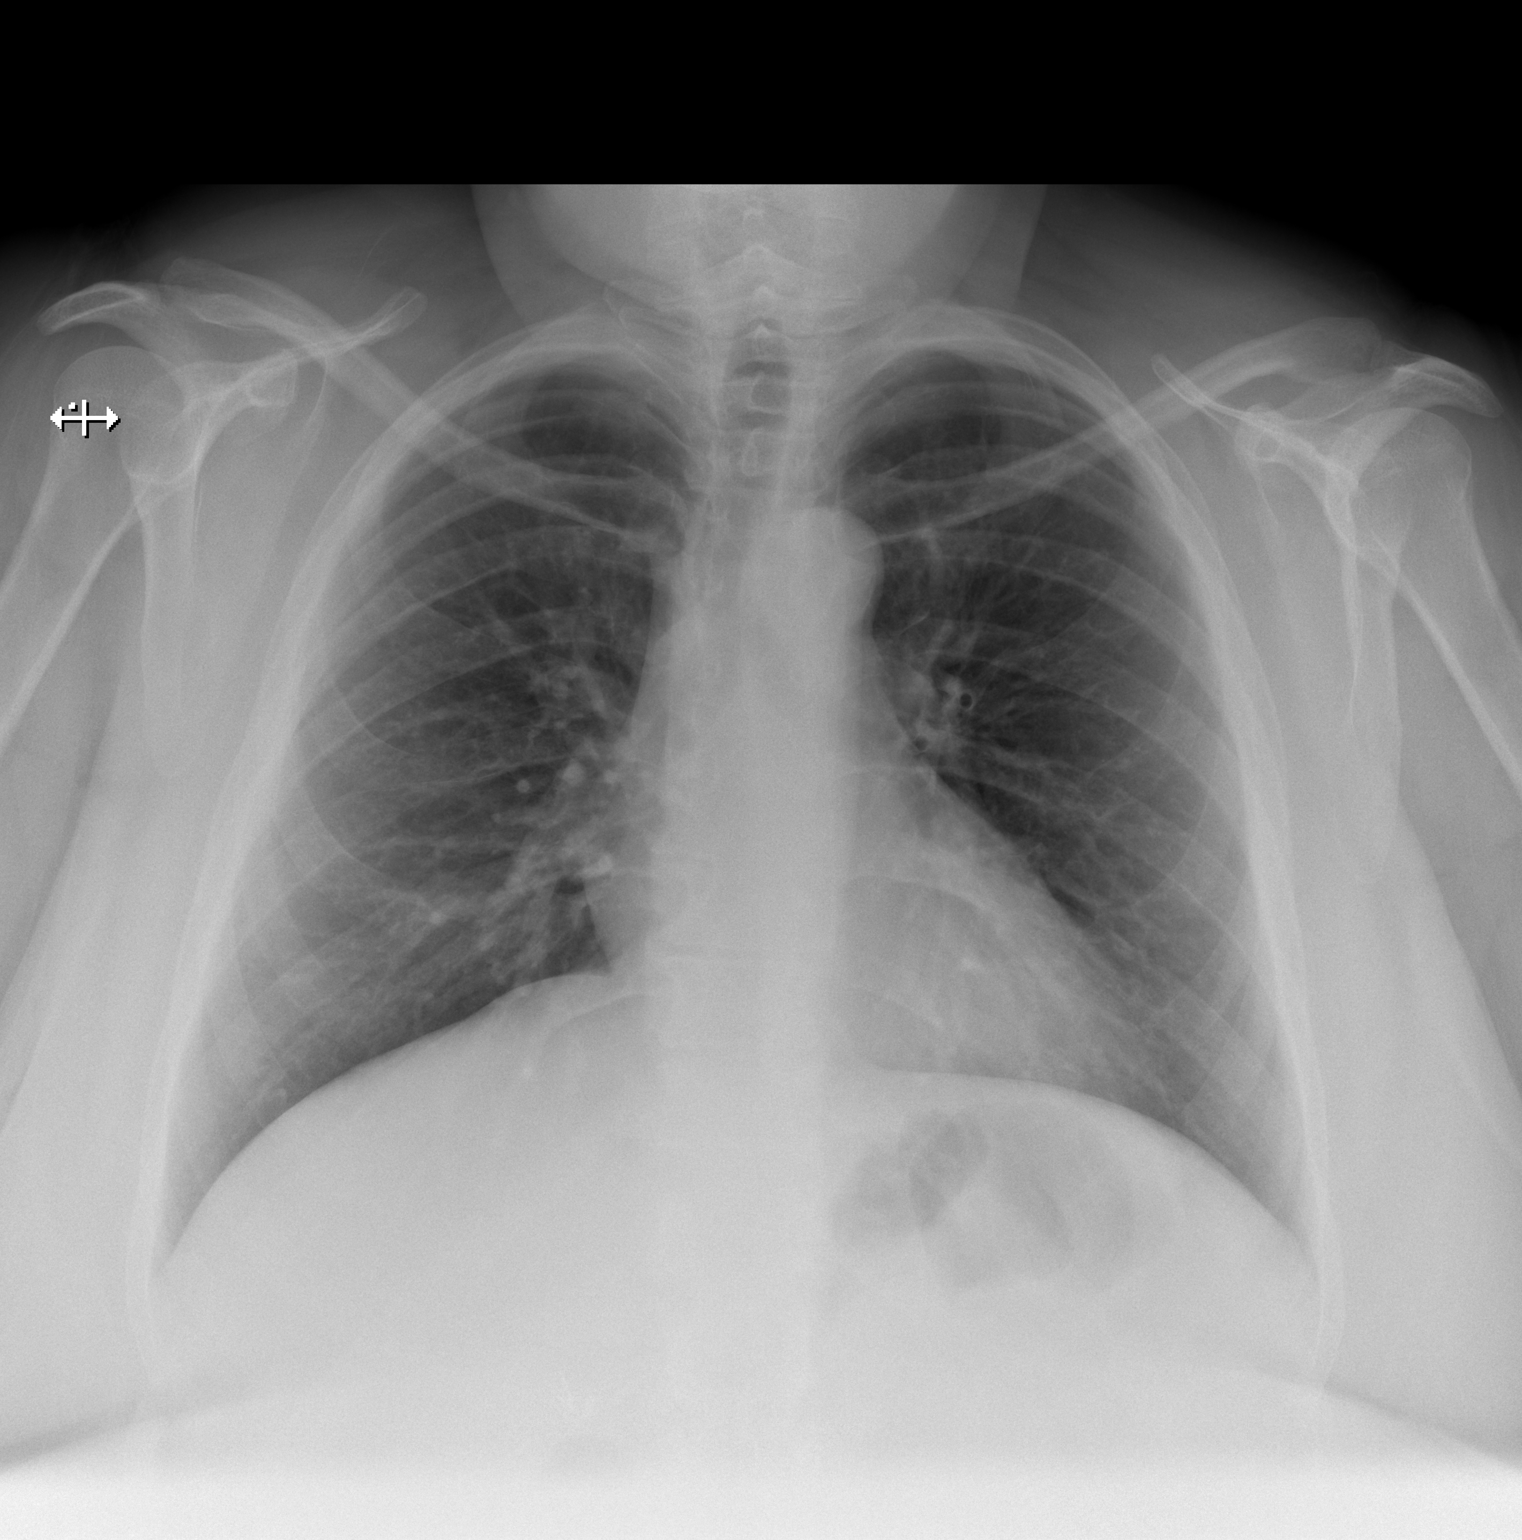

[w chest lat]
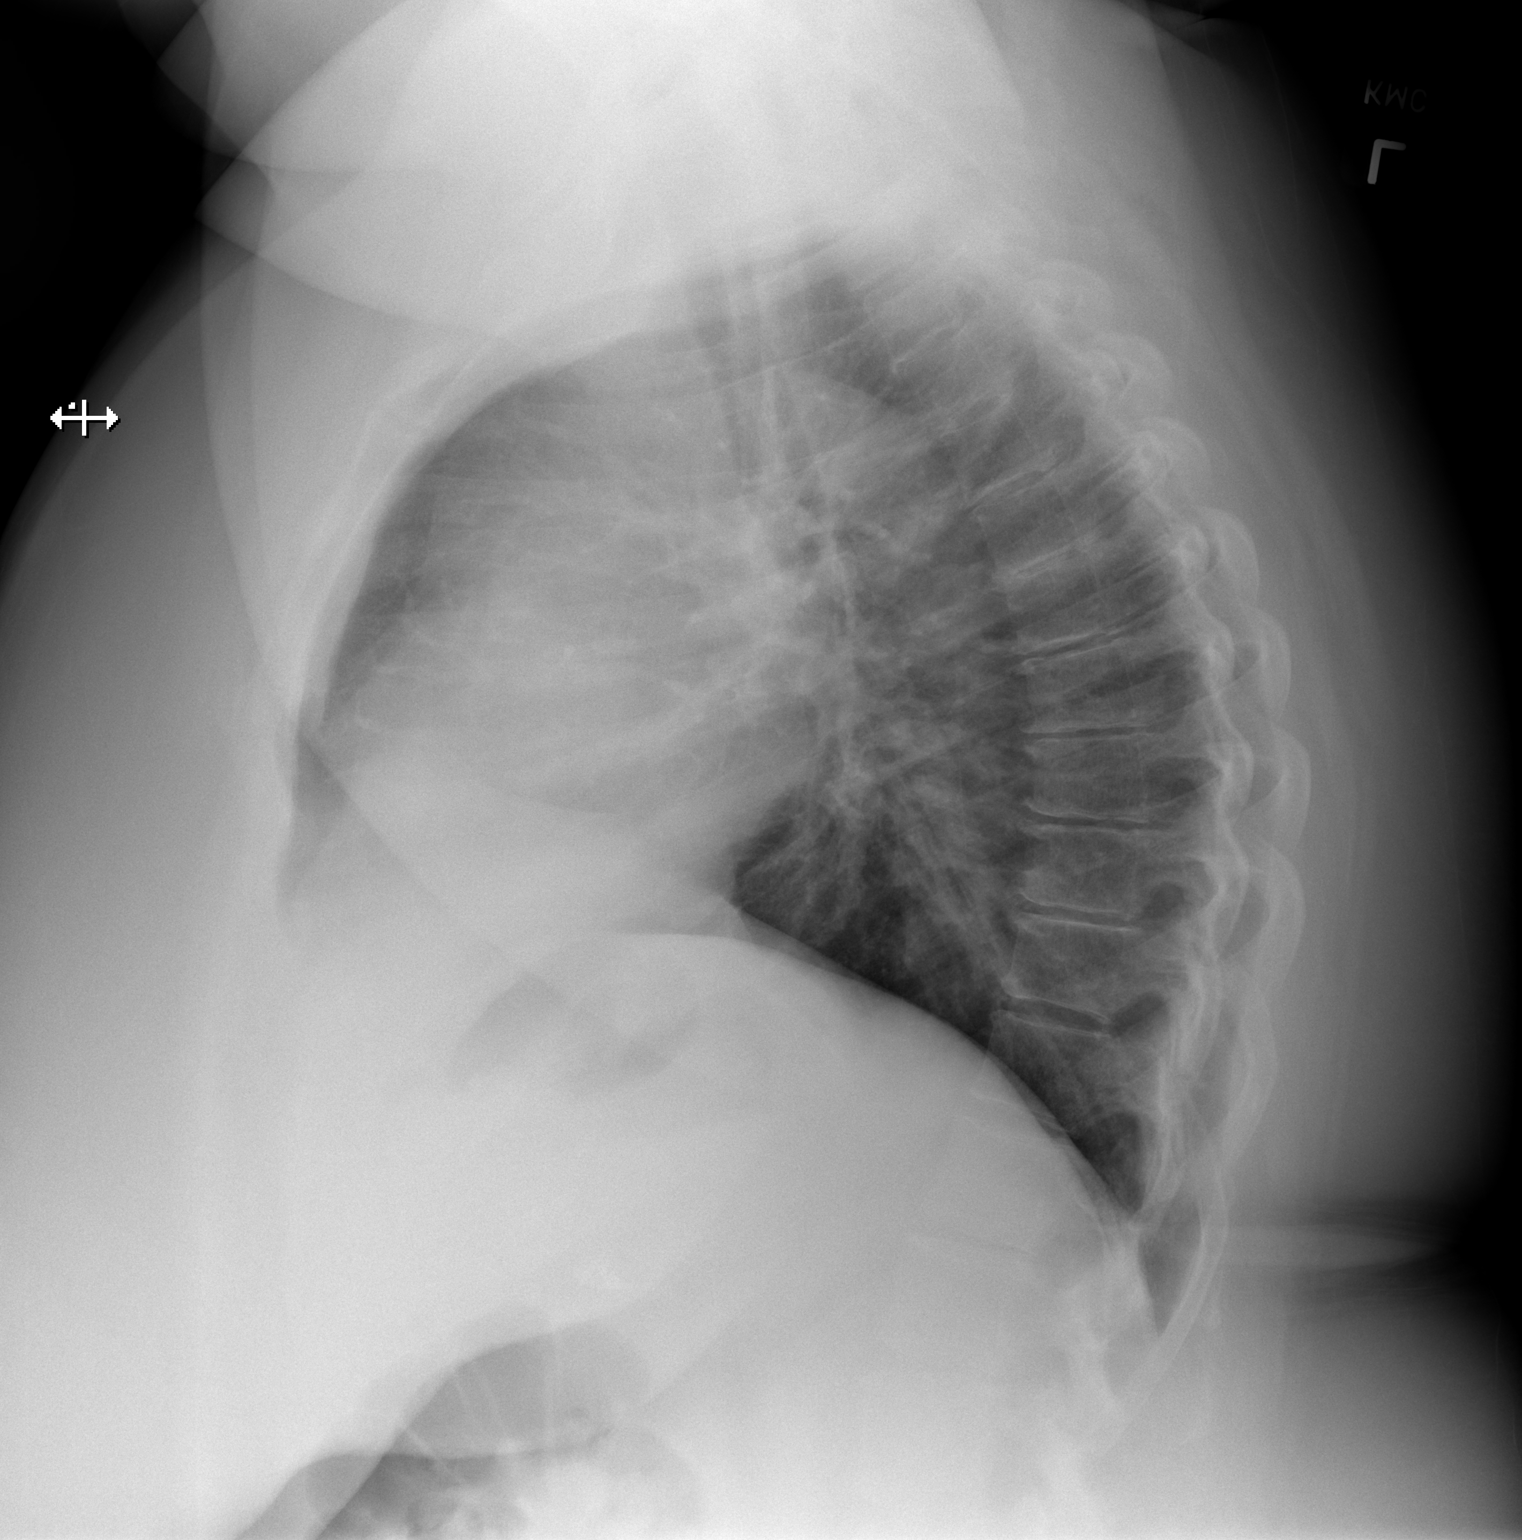

[2 of 2 positions shown; findings below may reference images not displayed]

FINDINGS: Lung volumes and mediastinal contours are stable and within normal
limits. Chronic increased AP dimension to the chest. Visualized
tracheal air column is within normal limits. Lung markings appear
stable since 0871 and both lungs appear clear. No pneumothorax or
pleural effusion.

No acute osseous abnormality identified. Thoracic disc and endplate
degeneration. Negative visible bowel gas pattern. Stable
cholecystectomy clips.
IMPRESSION: Negative.  No acute cardiopulmonary abnormality.

## 2022-03-13 DIAGNOSIS — I10 Essential (primary) hypertension: Secondary | ICD-10-CM | POA: Diagnosis not present

## 2022-03-13 DIAGNOSIS — Z760 Encounter for issue of repeat prescription: Secondary | ICD-10-CM | POA: Diagnosis not present

## 2022-03-13 DIAGNOSIS — Z6841 Body Mass Index (BMI) 40.0 and over, adult: Secondary | ICD-10-CM | POA: Diagnosis not present

## 2022-03-15 DIAGNOSIS — Z0130 Encounter for examination of blood pressure without abnormal findings: Secondary | ICD-10-CM | POA: Diagnosis not present

## 2022-03-15 DIAGNOSIS — Z760 Encounter for issue of repeat prescription: Secondary | ICD-10-CM | POA: Diagnosis not present

## 2022-03-15 DIAGNOSIS — I10 Essential (primary) hypertension: Secondary | ICD-10-CM | POA: Diagnosis not present

## 2022-04-19 ENCOUNTER — Ambulatory Visit: Payer: BC Managed Care – PPO | Admitting: Family

## 2022-04-19 ENCOUNTER — Encounter: Payer: Self-pay | Admitting: Family

## 2022-04-19 VITALS — BP 112/65 | HR 76 | Temp 97.3°F | Ht 67.0 in | Wt 263.0 lb

## 2022-04-19 DIAGNOSIS — I10 Essential (primary) hypertension: Secondary | ICD-10-CM | POA: Insufficient documentation

## 2022-04-19 MED ORDER — LISINOPRIL-HYDROCHLOROTHIAZIDE 20-25 MG PO TABS: 1.0000 | ORAL_TABLET | Freq: Every day | ORAL | 1 refills | Status: DC

## 2022-04-19 NOTE — Progress Notes (Signed)
New Patient Office Visit  Subjective:  Patient ID: Tammy Briggs, female    DOB: November 08, 1970  Age: 52 y.o. MRN: [id number]  CC:  Chief Complaint  Patient presents with   Transitions Of Care    Pt has no questions or concerns    HPI Tammy Briggs presents for establishing care today.  Hypertension: Patient is currently maintained on the following medications for blood pressure: Lisinopril-HCTZ Patient reports good compliance with blood pressure medications. Patient denies chest pain, headaches, shortness of breath or swelling. Last 3 blood pressure readings in our office are as follows: BP Readings from Last 3 Encounters:  04/19/22 112/65  05/05/20 134/84  04/02/19 124/81    Assessment & Plan:   Problem List Items Addressed This Visit       Cardiovascular and Mediastinum   Hypertension - Primary    chronic, stable taking Lisinopril-HCTZ 20-'25mg'$  qd, has take for about 2 yrs denies any concerns pt has lost about 40+ lbs over last 1-44yr on her own will check labs today no refill needed f/u in 6 mos      Relevant Orders   Lipid panel   Comp Met (CMET)    Subjective:    Outpatient Medications Prior to Visit  Medication Sig Dispense Refill   cetirizine (ZYRTEC) 10 MG tablet TAKE 1 TABLET BY MOUTH EVERY DAY 90 tablet 3   Cholecalciferol (VITAMIN D-3) 25 MCG (1000 UT) CAPS Take 1 capsule by mouth daily at 12 noon.     ELDERBERRY PO Take 1 tablet by mouth daily at 12 noon.     lisinopril-hydrochlorothiazide (ZESTORETIC) 20-25 MG tablet TAKE 1 TABLET BY MOUTH EVERY DAY 90 tablet 3   magnesium oxide (MAG-OX) 400 (240 Mg) MG tablet Take 400 mg by mouth daily.     azelastine (ASTELIN) 0.1 % nasal spray Place 1 spray into both nostrils 2 (two) times daily. Use in each nostril as directed 30 mL 12   azithromycin (ZITHROMAX) 250 MG tablet Take 500 mg today and 250 mg on days 2-5 (Patient not taking: Reported on 05/05/2020) 6 tablet 0   benzonatate (TESSALON) 200 MG  capsule Take 1 capsule (200 mg total) by mouth 2 (two) times daily as needed for cough. 20 capsule 0   guaiFENesin-dextromethorphan (ROBITUSSIN DM) 100-10 MG/5ML syrup Take 5 mLs by mouth every 4 (four) hours as needed for cough. 118 mL 0   levofloxacin (LEVAQUIN) 500 MG tablet Take 1 tablet (500 mg total) by mouth daily. 7 tablet 0   pantoprazole (PROTONIX) 40 MG tablet TAKE 1 TABLET BY MOUTH EVERY DAY 90 tablet 1   predniSONE (STERAPRED UNI-PAK 21 TAB) 10 MG (21) TBPK tablet Tapered steroid pack as directed. (Patient not taking: Reported on 05/05/2020) 21 tablet 0   QVAR REDIHALER 80 MCG/ACT inhaler INHALE 1 PUFF INTO THE LUNGS TWICE A DAY 10.6 g 3   VENTOLIN HFA 108 (90 Base) MCG/ACT inhaler TAKE 2 PUFFS BY MOUTH EVERY 6 HOURS AS NEEDED FOR WHEEZE OR SHORTNESS OF BREATH 18 each 3   No facility-administered medications prior to visit.   Past Medical History:  Diagnosis Date   Constipation    GERD (gastroesophageal reflux disease)    takes Nexium daily   History of bronchitis 3-479yrago   History of kidney stones    Hypertension    takes Lisinopril-HCTZ daily   Joint swelling    right    Muscle spasm    takes Flexeril daily as needed   Pneumonia  hx of-57yrago   PONV (postoperative nausea and vomiting)    Weakness    numbness and tingling left leg   Past Surgical History:  Procedure Laterality Date   adeoinectomy     CHOLECYSTECTOMY  89yrago   HPV removed  age 52 LUMBAR LAMINECTOMY/DECOMPRESSION MICRODISCECTOMY Left 10/05/2013   Procedure: LUMBAR LAMINECTOMY/DECOMPRESSION MICRODISCECTOMY 1 LEVEL L5-S1;  Surgeon: RaFaythe GheeMD;  Location: [facility];  Service: Neurosurgery;  Laterality: Left;  LUMBAR LAMINECTOMY/DECOMPRESSION MICRODISCECTOMY 1 LEVEL L5-S1   MYRINGOTOMY     x 4    Objective:   Today's Vitals: BP 112/65 (BP Location: Left Arm, Patient Position: Sitting)   Pulse 76   Temp (!) 97.3 F (36.3 C) (Temporal)   Ht '5\' 7"'$  (1.702 m)   Wt 263 lb (119.3 kg)    LMP 04/14/2022 (Exact Date)   SpO2 95%   BMI 41.19 kg/m   Physical Exam Vitals and nursing note reviewed.  Constitutional:      Appearance: Normal appearance. She is obese.  Cardiovascular:     Rate and Rhythm: Normal rate and regular rhythm.  Pulmonary:     Effort: Pulmonary effort is normal.     Breath sounds: Normal breath sounds.  Musculoskeletal:        General: Normal range of motion.  Skin:    General: Skin is warm and dry.  Neurological:     Mental Status: She is alert.  Psychiatric:        Mood and Affect: Mood normal.        Behavior: Behavior normal.    No orders of the defined types were placed in this encounter.   HuJeanie SewerNP

## 2022-04-19 NOTE — Assessment & Plan Note (Signed)
chronic, stable taking Lisinopril-HCTZ 20-'25mg'$  qd, has take for about 2 yrs denies any concerns pt has lost about 40+ lbs over last 1-48yr on her own will check labs today no refill needed f/u in 6 mos

## 2022-04-19 NOTE — Patient Instructions (Addendum)
It was very nice to meet you today!   I will review your lab results via MyChart in a few days.  Have a great weekend!    PLEASE NOTE:  If you had any lab tests please let us know if you have not heard back within a few days. You may see your results on MyChart before we have a chance to review them but we will give you a call once they are reviewed by [place]. If we ordered any referrals today, please let us know if you have not heard from their office within the next week.

## 2022-04-20 LAB — COMPREHENSIVE METABOLIC PANEL
AG Ratio: 1.8 (calc) (ref 1.0–2.5)
ALT: 22 U/L (ref 6–29)
AST: 23 U/L (ref 10–35)
Albumin: 3.9 g/dL (ref 3.6–5.1)
Alkaline phosphatase (APISO): 76 U/L (ref 37–153)
BUN: 15 mg/dL (ref 7–25)
CO2: 25 mmol/L (ref 20–32)
Calcium: 9.3 mg/dL (ref 8.6–10.4)
Chloride: 103 mmol/L (ref 98–110)
Creat: 0.66 mg/dL (ref 0.50–1.03)
Globulin: 2.2 g/dL (calc) (ref 1.9–3.7)
Glucose, Bld: 84 mg/dL (ref 65–99)
Potassium: 3.8 mmol/L (ref 3.5–5.3)
Sodium: 140 mmol/L (ref 135–146)
Total Bilirubin: 0.3 mg/dL (ref 0.2–1.2)
Total Protein: 6.1 g/dL (ref 6.1–8.1)

## 2022-04-20 LAB — LIPID PANEL
Cholesterol: 154 mg/dL (ref <200)
HDL: 48 mg/dL — ABNORMAL LOW (ref >=50)
LDL Cholesterol (Calc): 91 mg/dL (calc)
Non-HDL Cholesterol (Calc): 106 mg/dL (calc) (ref <130)
Total CHOL/HDL Ratio: 3.2 (calc) (ref <5.0)
Triglycerides: 60 mg/dL (ref <150)

## 2022-04-23 NOTE — Progress Notes (Signed)
All of your labs are within normal range.  Glucose (sugar), electrolytes, kidney and liver function, and cholesterol numbers are all good.   Keep up the good work with controlling your diet and continue to try and shoot for 30 minutes of exercise daily!

## 2022-06-21 DIAGNOSIS — H40023 Open angle with borderline findings, high risk, bilateral: Secondary | ICD-10-CM | POA: Diagnosis not present

## 2022-07-04 ENCOUNTER — Encounter: Payer: Self-pay | Admitting: Family

## 2022-07-04 DIAGNOSIS — I10 Essential (primary) hypertension: Secondary | ICD-10-CM

## 2022-07-05 MED ORDER — LISINOPRIL-HYDROCHLOROTHIAZIDE 20-25 MG PO TABS: 1.0000 | ORAL_TABLET | Freq: Every day | ORAL | 1 refills | Status: DC

## 2022-07-09 ENCOUNTER — Other Ambulatory Visit: Payer: Self-pay

## 2022-07-09 DIAGNOSIS — I10 Essential (primary) hypertension: Secondary | ICD-10-CM

## 2022-07-09 MED ORDER — LISINOPRIL-HYDROCHLOROTHIAZIDE 20-25 MG PO TABS: 1.0000 | ORAL_TABLET | Freq: Every day | ORAL | 1 refills | Status: DC

## 2022-10-17 ENCOUNTER — Other Ambulatory Visit: Payer: Self-pay | Admitting: Family

## 2022-10-17 DIAGNOSIS — I10 Essential (primary) hypertension: Secondary | ICD-10-CM

## 2022-12-18 DIAGNOSIS — H524 Presbyopia: Secondary | ICD-10-CM | POA: Diagnosis not present

## 2022-12-18 DIAGNOSIS — H40023 Open angle with borderline findings, high risk, bilateral: Secondary | ICD-10-CM | POA: Diagnosis not present

## 2022-12-18 DIAGNOSIS — D23122 Other benign neoplasm of skin of left lower eyelid, including canthus: Secondary | ICD-10-CM | POA: Diagnosis not present

## 2022-12-18 DIAGNOSIS — H5213 Myopia, bilateral: Secondary | ICD-10-CM | POA: Diagnosis not present

## 2022-12-18 DIAGNOSIS — D23112 Other benign neoplasm of skin of right lower eyelid, including canthus: Secondary | ICD-10-CM | POA: Diagnosis not present

## 2023-01-13 ENCOUNTER — Other Ambulatory Visit: Payer: Self-pay | Admitting: Family

## 2023-01-13 DIAGNOSIS — I10 Essential (primary) hypertension: Secondary | ICD-10-CM

## 2023-01-13 NOTE — Telephone Encounter (Signed)
Medication: Lisinopril-Hydrochlorothiazide 20-25 Directions: Take 1 tablet by mouth daily  Last given: 07/09/22 Number refills: 1 Last o/v: 04/19/22 Follow up: 6 months  Labs: 04/19/22  Patient needs office visit (overdue) for 6 month f/u

## 2023-02-21 ENCOUNTER — Other Ambulatory Visit: Payer: Self-pay | Admitting: Family

## 2023-02-21 DIAGNOSIS — I10 Essential (primary) hypertension: Secondary | ICD-10-CM

## 2023-03-27 ENCOUNTER — Other Ambulatory Visit: Payer: Self-pay | Admitting: Family

## 2023-03-27 DIAGNOSIS — I10 Essential (primary) hypertension: Secondary | ICD-10-CM

## 2023-04-28 ENCOUNTER — Other Ambulatory Visit: Payer: Self-pay | Admitting: Family

## 2023-04-28 DIAGNOSIS — I10 Essential (primary) hypertension: Secondary | ICD-10-CM

## 2023-04-28 MED ORDER — LISINOPRIL-HYDROCHLOROTHIAZIDE 20-25 MG PO TABS
1.0000 | ORAL_TABLET | Freq: Every day | ORAL | 0 refills | Status: DC
Start: 2023-04-28 — End: 2023-05-23

## 2023-04-28 NOTE — Telephone Encounter (Signed)
 Copied from CRM [id number]. Topic: Clinical - Medication Refill >> Apr 28, 2023  2:35 PM Corin V wrote: Most Recent Primary Care Visit:  Provider: Dulce Sellar  Department: [facility]  Visit Type: TRANSFER OF CARE  Date: 04/19/2022  Medication: lisinopril-hydrochlorothiazide (ZESTORETIC) 20-25 MG tablet  Has the patient contacted their pharmacy? Yes (Agent: If no, request that the patient contact the pharmacy for the refill. If patient does not wish to contact the pharmacy document the reason why and proceed with request.) (Agent: If yes, when and what did the pharmacy advise?)  Is this the correct pharmacy for this prescription? Yes If no, delete pharmacy and type the correct one.  This is the patient's preferred pharmacy:  [facility], [place] - [address] [place] [address] Phone: [phone] Fax: [phone]   Has the prescription been filled recently? No  Is the patient out of the medication? Yes  Has the patient been seen for an appointment in the last year OR does the patient have an upcoming appointment? Yes  Can we respond through MyChart? No  Agent: Please be advised that Rx refills may take up to 3 business days. We ask that you follow-up with your pharmacy.

## 2023-04-28 NOTE — Telephone Encounter (Signed)
 Rx sent to the pharmacy.  Copied from CRM [id number]. Topic: Clinical - Medication Question >> Apr 28, 2023  4:22 PM Tammy Briggs wrote: Reason for CRM: Patient is calling regarding a refill request she submitted for lisinopril-hydrochlorothiazide (ZESTORETIC) 20-25 MG tablet , I did advise patient of the turn around time,;however, patient states she is completely out and would like to know if there is anything we can do to expedite the process.

## 2023-05-17 ENCOUNTER — Telehealth: Admitting: Nurse Practitioner

## 2023-05-17 DIAGNOSIS — J302 Other seasonal allergic rhinitis: Secondary | ICD-10-CM

## 2023-05-17 MED ORDER — LORATADINE 10 MG PO TABS: 10.0000 mg | ORAL_TABLET | Freq: Every day | ORAL | 0 refills | Status: DC

## 2023-05-17 MED ORDER — IPRATROPIUM BROMIDE 0.03 % NA SOLN: 2.0000 | Freq: Two times a day (BID) | NASAL | 0 refills | Status: DC

## 2023-05-17 MED ORDER — ALBUTEROL SULFATE HFA 108 (90 BASE) MCG/ACT IN AERS: 2.0000 | INHALATION_SPRAY | Freq: Four times a day (QID) | RESPIRATORY_TRACT | 0 refills | Status: DC | PRN

## 2023-05-17 NOTE — Progress Notes (Signed)
 I have spent 5 minutes in review of e-visit questionnaire, review and updating patient chart, medical decision making and response to patient.   Claiborne Rigg, NP

## 2023-05-17 NOTE — Progress Notes (Signed)
 E visit for Allergic Rhinitis We are sorry that you are not feeling well.  Here is how we plan to help!  Based on what you have shared with me it looks like you have Allergic Rhinitis.  Rhinitis is when a reaction occurs that causes nasal congestion, runny nose, sneezing, and itching.  Most types of rhinitis are caused by an inflammation and are associated with symptoms in the eyes ears or throat. There are several types of rhinitis.  The most common are acute rhinitis, which is usually caused by a viral illness, allergic or seasonal rhinitis, and nonallergic or year-round rhinitis.  Nasal allergies occur certain times of the year.  Allergic rhinitis is caused when allergens in the air trigger the release of histamine in the body.  Histamine causes itching, swelling, and fluid to build up in the fragile linings of the nasal passages, sinuses and eyelids.  An itchy nose and clear discharge are common.  I recommend the following over the counter treatments: You should take a daily dose of antihistamine. I have prescribed claritin at this time.  I also would recommend a nasal spray: I have sent atrovent nasal spray  I have also sent an inhaler for wheezing.    HOME CARE:  You can use an over-the-counter saline nasal spray as needed Avoid areas where there is heavy dust, mites, or molds Stay indoors on windy days during the pollen season Keep windows closed in home, at least in bedroom; use air conditioner. Use high-efficiency house air filter Keep windows closed in car, turn AC on re-circulate Avoid playing out with dog during pollen season  GET HELP RIGHT AWAY IF:  If your symptoms do not improve within 10 days You become short of breath You develop yellow or green discharge from your nose for over 3 days You have coughing fits  MAKE SURE YOU:  Understand these instructions Will watch your condition Will get help right away if you are not doing well or get worse  Thank you for  choosing an e-visit. Your e-visit answers were reviewed by a board certified advanced clinical practitioner to complete your personal care plan. Depending upon the condition, your plan could have included both over the counter or prescription medications. Please review your pharmacy choice. Be sure that the pharmacy you have chosen is open so that you can pick up your prescription now.  If there is a problem you may message your provider in MyChart to have the prescription routed to another pharmacy. Your safety is important to [place]. If you have drug allergies check your prescription carefully.  For the next 24 hours, you can use MyChart to ask questions about today's visit, request a non-urgent call back, or ask for a work or school excuse from your e-visit provider. You will get an email in the next two days asking about your experience. I hope that your e-visit has been valuable and will speed your recovery.

## 2023-05-23 ENCOUNTER — Other Ambulatory Visit: Payer: Self-pay | Admitting: Family

## 2023-05-23 DIAGNOSIS — I10 Essential (primary) hypertension: Secondary | ICD-10-CM

## 2023-05-23 MED ORDER — LISINOPRIL-HYDROCHLOROTHIAZIDE 20-25 MG PO TABS: 1.0000 | ORAL_TABLET | Freq: Every day | ORAL | 0 refills | Status: DC

## 2023-06-09 ENCOUNTER — Other Ambulatory Visit: Payer: Self-pay | Admitting: Nurse Practitioner

## 2023-06-09 ENCOUNTER — Other Ambulatory Visit: Payer: Self-pay | Admitting: Family

## 2023-06-09 DIAGNOSIS — J302 Other seasonal allergic rhinitis: Secondary | ICD-10-CM

## 2023-06-12 DIAGNOSIS — M5489 Other dorsalgia: Secondary | ICD-10-CM | POA: Diagnosis not present

## 2023-06-12 DIAGNOSIS — R350 Frequency of micturition: Secondary | ICD-10-CM | POA: Diagnosis not present

## 2023-06-27 ENCOUNTER — Other Ambulatory Visit: Payer: Self-pay | Admitting: Family

## 2023-06-27 DIAGNOSIS — I10 Essential (primary) hypertension: Secondary | ICD-10-CM

## 2023-06-27 NOTE — Telephone Encounter (Signed)
 Copied from CRM [id number]. Topic: Clinical - Medication Refill >> Jun 27, 2023  9:39 AM Felizardo Hotter wrote: Most Recent Primary Care Visit:  Provider: Versa Gore  Department: [facility]  Visit Type: TRANSFER OF CARE  Date: 04/19/2022  Medication: lisinopril -hydrochlorothiazide  (ZESTORETIC ) 20-25 MG tablet  Has the patient contacted their pharmacy? Yes (Agent: If no, request that the patient contact the pharmacy for the refill. If patient does not wish to contact the pharmacy document the reason why and proceed with request.) (Agent: If yes, when and what did the pharmacy advise?)  Is this the correct pharmacy for this prescription? Yes If no, delete pharmacy and type the correct one.  This is the patient's preferred pharmacy:   [facility], [place] - [address] [place] [address] Phone: [phone] Fax: [phone]  Has the prescription been filled recently? Yes  Is the patient out of the medication? Yes  Has the patient been seen for an appointment in the last year OR does the patient have an upcoming appointment? Yes  Can we respond through MyChart? Yes  Agent: Please be advised that Rx refills may take up to 3 business days. We ask that you follow-up with your pharmacy.

## 2023-06-30 MED ORDER — LISINOPRIL-HYDROCHLOROTHIAZIDE 20-25 MG PO TABS: 1.0000 | ORAL_TABLET | Freq: Every day | ORAL | 0 refills | Status: DC

## 2023-07-02 ENCOUNTER — Ambulatory Visit: Admitting: Family

## 2023-07-03 ENCOUNTER — Other Ambulatory Visit: Payer: Self-pay | Admitting: Family

## 2023-07-03 DIAGNOSIS — J302 Other seasonal allergic rhinitis: Secondary | ICD-10-CM

## 2023-07-08 ENCOUNTER — Encounter: Payer: Self-pay | Admitting: Family

## 2023-07-08 ENCOUNTER — Encounter (INDEPENDENT_AMBULATORY_CARE_PROVIDER_SITE_OTHER): Payer: Self-pay

## 2023-07-08 ENCOUNTER — Ambulatory Visit: Admitting: Family

## 2023-07-08 VITALS — BP 121/82 | HR 63 | Temp 97.2°F | Ht 67.0 in | Wt 278.1 lb

## 2023-07-08 DIAGNOSIS — I10 Essential (primary) hypertension: Secondary | ICD-10-CM | POA: Diagnosis not present

## 2023-07-08 DIAGNOSIS — Z1159 Encounter for screening for other viral diseases: Secondary | ICD-10-CM

## 2023-07-08 DIAGNOSIS — Z01419 Encounter for gynecological examination (general) (routine) without abnormal findings: Secondary | ICD-10-CM

## 2023-07-08 DIAGNOSIS — Z1231 Encounter for screening mammogram for malignant neoplasm of breast: Secondary | ICD-10-CM

## 2023-07-08 LAB — COMPREHENSIVE METABOLIC PANEL WITH GFR
ALT: 22 U/L (ref 0–35)
AST: 23 U/L (ref 0–37)
Albumin: 4 g/dL (ref 3.5–5.2)
Alkaline Phosphatase: 87 U/L (ref 39–117)
BUN: 19 mg/dL (ref 6–23)
CO2: 29 meq/L (ref 19–32)
Calcium: 9.3 mg/dL (ref 8.4–10.5)
Chloride: 102 meq/L (ref 96–112)
Creatinine, Ser: 0.67 mg/dL (ref 0.40–1.20)
GFR: 99.85 mL/min (ref >60.00)
Glucose, Bld: 85 mg/dL (ref 70–99)
Potassium: 4.3 meq/L (ref 3.5–5.1)
Sodium: 139 meq/L (ref 135–145)
Total Bilirubin: 0.3 mg/dL (ref 0.2–1.2)
Total Protein: 6.4 g/dL (ref 6.0–8.3)

## 2023-07-08 NOTE — Progress Notes (Signed)
 Patient ID: Tammy Briggs, female    DOB: 07-02-1970, 53 y.o.   MRN: [id number]  Chief Complaint  Patient presents with   Hypertension   Obesity  Discussed the use of AI scribe software for clinical note transcription with the patient, who gave verbal consent to proceed.  History of Present Illness Tammy Briggs is a 53 year old female with hypertension who presents for a follow-up visit.  She manages hypertension with lisinopril  and hydrochlorothiazide , taken in the mornings. She adheres to her medication regimen but has not been monitoring her blood pressure at home, despite having a cuff available. She experiences no headaches or dizziness and tolerates the diuretic effect without excessive urination.  She experiences seasonal allergies and uses Zyrtec  nightly to prevent hives and cold symptoms, occasionally taking Claritin  on severe allergy days. She has not used nasal sprays like Flonase or Nasacort.  She takes magnesium supplements, which she finds beneficial for blood pressure, bowel regularity, and stress management.  She is working and attempts to stay hydrated, though she finds it challenging to drink two liters of water  daily. She feels better on days when she drinks more water .  Assessment & Plan Hypertension Hypertension well-managed with lisinopril -hydrochlorothiazide  20-25mg . Need CMP today. Prescription renewal pending lab results. BP good today. - Order kidney, electrolyte and liver function tests. - Hold prescription for lisinopril -hydrochlorothiazide  until lab results are reviewed. - Encourage regular home blood pressure monitoring if feeling symptomatic. - Advise on lifestyle modifications including exercise, salt intake reduction, and adequate hydration. - F/U in 6 mos  Morbid Obesity - Discussed GLP-1 receptor agonists for weight loss. Zepbound noted as more effective. Explained mechanism and side effects. Emphasized balanced diet and long-term use for  sustained benefit. - Verify insurance coverage for GLP-1 receptor agonists. - Send referral to [facility] for additional support. - Educate on potential side effects of GLP-1 meds and dietary adjustments to mitigate them. - Discuss long-term plan for medication tapering to maintain weight loss for best effectiveness. - Wt. Loss strategies reviewed including portion control, less carbs including sweets, eating most of calories earlier in day, drinking 64oz water  qd, and establishing daily exercise routine. - F/U prn  Encounter for screening mammogram for malignant neoplasm of breast  - MM 3D SCREENING MAMMOGRAM BILATERAL BREAST; Future  Encounter for routine gynecological examination with Papanicolaou smear of cervix  - Ambulatory referral to Gynecology  Need for hepatitis C screening test  - Hepatitis C Antibody   Subjective:     Outpatient Medications Prior to Visit  Medication Sig Dispense Refill   albuterol  (VENTOLIN  HFA) 108 (90 Base) MCG/ACT inhaler Inhale 2 puffs into the lungs every 6 (six) hours as needed for wheezing or shortness of breath. 8 g 0   cetirizine  (ZYRTEC ) 10 MG tablet TAKE 1 TABLET BY MOUTH EVERY DAY 90 tablet 3   Cholecalciferol (VITAMIN D-3) 25 MCG (1000 UT) CAPS Take 1 capsule by mouth daily at 12 noon.     ELDERBERRY PO Take 1 tablet by mouth daily at 12 noon.     ipratropium (ATROVENT ) 0.03 % nasal spray Place 2 sprays into both nostrils every 12 (twelve) hours. 30 mL 0   lisinopril -hydrochlorothiazide  (ZESTORETIC ) 20-25 MG tablet Take 1 tablet by mouth daily. 30 tablet 0   loratadine  (CLARITIN ) 10 MG tablet TAKE 1 TABLET BY MOUTH EVERY DAY 30 tablet 0   magnesium oxide (MAG-OX) 400 (240 Mg) MG tablet Take 400 mg by mouth daily.  No facility-administered medications prior to visit.   Past Medical History:  Diagnosis Date   Constipation    GERD (gastroesophageal reflux disease)    takes Nexium daily   History of bronchitis  3-49yrs ago   History of kidney stones    Hypertension    takes Lisinopril -HCTZ daily   Joint swelling    right    Muscle spasm    takes Flexeril  daily as needed   Pneumonia hx of-80yr ago   PONV (postoperative nausea and vomiting)    Weakness    numbness and tingling left leg   Past Surgical History:  Procedure Laterality Date   adeoinectomy     CHOLECYSTECTOMY  70yrs ago   HPV removed  age 75   LUMBAR LAMINECTOMY/DECOMPRESSION MICRODISCECTOMY Left 10/05/2013   Procedure: LUMBAR LAMINECTOMY/DECOMPRESSION MICRODISCECTOMY 1 LEVEL L5-S1;  Surgeon: Augustine Blocker, MD;  Location: [facility];  Service: Neurosurgery;  Laterality: Left;  LUMBAR LAMINECTOMY/DECOMPRESSION MICRODISCECTOMY 1 LEVEL L5-S1   MYRINGOTOMY     x 4   No Known Allergies    Objective:    Physical Exam Vitals and nursing note reviewed.  Constitutional:      Appearance: Normal appearance. She is morbidly obese.  Cardiovascular:     Rate and Rhythm: Normal rate and regular rhythm.  Pulmonary:     Effort: Pulmonary effort is normal.     Breath sounds: Normal breath sounds.  Musculoskeletal:        General: Normal range of motion.  Skin:    General: Skin is warm and dry.  Neurological:     Mental Status: She is alert.  Psychiatric:        Mood and Affect: Mood normal.        Behavior: Behavior normal.    BP 121/82 (BP Location: Left Arm, Patient Position: Sitting, Cuff Size: Large)   Pulse 63   Temp (!) 97.2 F (36.2 C) (Temporal)   Ht 5\' 7"  (1.702 m)   Wt 278 lb 2 oz (126.2 kg)   SpO2 98%   BMI 43.56 kg/m  Wt Readings from Last 3 Encounters:  07/08/23 278 lb 2 oz (126.2 kg)  04/19/22 263 lb (119.3 kg)  05/05/20 (!) 318 lb 3.2 oz (144.3 kg)      Versa Gore, NP

## 2023-07-08 NOTE — Assessment & Plan Note (Signed)
 Discussed GLP-1 receptor agonists for weight loss. Zepbound noted as more effective. Explained mechanism and side effects. Emphasized balanced diet and long-term use for sustained benefit. - Verify insurance coverage for GLP-1 receptor agonists. - Send referral to [facility] for additional support. - Educate on potential side effects of GLP-1 meds and dietary adjustments to mitigate them. - Discuss long-term plan for medication tapering to maintain weight loss for best effectiveness. - F/U prn

## 2023-07-08 NOTE — Assessment & Plan Note (Signed)
 Hypertension well-managed with lisinopril -hydrochlorothiazide  20-25mg . Need CMP today. Prescription renewal pending lab results. BP good today. - Order kidney, electrolyte and liver function tests. - Hold prescription for lisinopril -hydrochlorothiazide  until lab results are reviewed. - Encourage regular home blood pressure monitoring if feeling symptomatic. - Advise on lifestyle modifications including exercise, salt intake reduction, and adequate hydration. - F/U in 6 mos

## 2023-07-08 NOTE — Patient Instructions (Addendum)
 It was very nice to see you today!   I will review your lab results via MyChart in a few days. I will send a refill on the Lisinopril -HCTZ after I review your labs.  I have sent the referrals to Gynecology and Healthy weight clinic. If you find out any weight loss medications are covered, let me know and I can get you started.  Please schedule a 6 month follow up visit today.      PLEASE NOTE:  If you had any lab tests please let us  know if you have not heard back within a few days. You may see your results on MyChart before we have a chance to review them but we will give you a call once they are reviewed by us . If we ordered any referrals today, please let us  know if you have not heard from their office within the next week.

## 2023-07-09 ENCOUNTER — Other Ambulatory Visit (HOSPITAL_COMMUNITY): Payer: Self-pay

## 2023-07-09 ENCOUNTER — Telehealth: Payer: Self-pay

## 2023-07-09 ENCOUNTER — Ambulatory Visit: Payer: Self-pay | Admitting: Family

## 2023-07-09 LAB — HEPATITIS C ANTIBODY: Hepatitis C Ab: NONREACTIVE

## 2023-07-09 MED ORDER — LISINOPRIL-HYDROCHLOROTHIAZIDE 20-25 MG PO TABS: 1.0000 | ORAL_TABLET | Freq: Every day | ORAL | 1 refills | Status: DC

## 2023-07-09 MED ORDER — TIRZEPATIDE-WEIGHT MANAGEMENT 2.5 MG/0.5ML ~~LOC~~ SOLN: 2.5000 mg | SUBCUTANEOUS | 1 refills | Status: DC

## 2023-07-09 NOTE — Addendum Note (Signed)
 Addended by: Amen Dargis on: 07/09/2023 01:12 PM   Modules accepted: Orders

## 2023-07-09 NOTE — Telephone Encounter (Signed)
.  Pharmacy Patient Advocate Encounter   Received notification from Pt Calls Messages that prior authorization for ZEPBOUND is required/requested.   Insurance verification completed.   The patient is insured through [facility] .   Per test claim: Her insurance doesn't cover weight loss drug per [employer]

## 2023-07-09 NOTE — Telephone Encounter (Signed)
 Copied from CRM [id number]. Topic: General - Other >> Jul 09, 2023  1:16 PM Howard Macho wrote: Reason for CRM: brian from the drugstore called stating the provider sent in a script for zepbound, but it was sent in for the vial instead of the prefilled syringes. The vial is not available for retail sale. Polly Brink need a script for zepbound prefilled syringes  CB [phone]

## 2023-07-10 ENCOUNTER — Other Ambulatory Visit: Payer: Self-pay

## 2023-07-10 MED ORDER — ZEPBOUND 2.5 MG/0.5ML ~~LOC~~ SOAJ: 2.5000 mg | SUBCUTANEOUS | 1 refills | Status: DC

## 2023-07-10 NOTE — Progress Notes (Signed)
 ok send in correct RX for syringes, thx

## 2023-08-06 DIAGNOSIS — H40023 Open angle with borderline findings, high risk, bilateral: Secondary | ICD-10-CM | POA: Diagnosis not present

## 2023-08-12 ENCOUNTER — Telehealth: Payer: Self-pay

## 2023-08-12 NOTE — Telephone Encounter (Signed)
 Left voicemail to patient to call office back so I could go over her insurance not taking care of weight loss medication as well as scheduling appointment with weight management.

## 2023-08-12 NOTE — Telephone Encounter (Signed)
-----   Message from Versa Gore sent at 08/05/2023  5:31 PM EDT ----- Regarding: RE: zepbound  Please call patient and make sure she knows her insurance unfortunately will not cover any weight loss medications. I encourage her to schedule with they [facility] that I sent a referral to . They sent her a MyChart message to call them and schedule. Thanks. ----- Message ----- From: Patric Booker, CPhT Sent: 07/09/2023   3:40 PM EDT To: Versa Gore, NP Subject: RE: zepbound                                    I ran a test bill to complete PA however I received a rejection with the message..Weight loss drugs not covered. Pt will need to contact insurance for further explanation of benefits. ----- Message ----- From: Versa Gore, NP Sent: 07/09/2023   1:10 PM EDT To: Rx Prior Auth Team Subject: zepbound                                        Pt reports med is covered under her insurance, please start PA, thank you!

## 2023-08-13 ENCOUNTER — Other Ambulatory Visit: Payer: Self-pay | Admitting: Nurse Practitioner

## 2023-08-13 DIAGNOSIS — J302 Other seasonal allergic rhinitis: Secondary | ICD-10-CM

## 2023-10-28 ENCOUNTER — Other Ambulatory Visit: Payer: Self-pay | Admitting: Family

## 2023-10-28 DIAGNOSIS — I10 Essential (primary) hypertension: Secondary | ICD-10-CM

## 2023-11-11 ENCOUNTER — Telehealth: Admitting: Physician Assistant

## 2023-11-11 DIAGNOSIS — R058 Other specified cough: Secondary | ICD-10-CM

## 2023-11-11 MED ORDER — PREDNISONE 10 MG (21) PO TBPK: ORAL_TABLET | ORAL | 0 refills | Status: DC

## 2023-11-11 MED ORDER — BENZONATATE 100 MG PO CAPS: 100.0000 mg | ORAL_CAPSULE | Freq: Three times a day (TID) | ORAL | 0 refills | Status: DC | PRN

## 2023-11-11 NOTE — Progress Notes (Signed)
 E-Visit for Cough   We are sorry that you are not feeling well.  Here is how we plan to help!  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 This is a chronic cough after a moderate upper respiratory infection that is caused from residual inflammation in the airway from the previous infection.     In addition you may use A prescription cough medication called Tessalon  Perles 100mg . You may take 1-2 capsules every 8 hours as needed for your cough.  Prednisone  10 mg daily for 6 days (see taper instructions below)  Directions for 6 day taper: Day 1: 2 tablets before breakfast, 1 after both lunch & dinner and 2 at bedtime Day 2: 1 tab before breakfast, 1 after both lunch & dinner and 2 at bedtime Day 3: 1 tab at each meal & 1 at bedtime Day 4: 1 tab at breakfast, 1 at lunch, 1 at bedtime Day 5: 1 tab at breakfast & 1 tab at bedtime Day 6: 1 tab at breakfast  From your responses in the eVisit questionnaire you describe inflammation in the upper respiratory tract which is causing a significant cough.  This is commonly called Bronchitis and has four common causes:   Allergies Viral Infections Acid Reflux Bacterial Infection Allergies, viruses and acid reflux are treated by controlling symptoms or eliminating the cause. An example might be a cough caused by taking certain blood pressure medications. You stop the cough by changing the medication. Another example might be a cough caused by acid reflux. Controlling the reflux helps control the cough.  USE OF BRONCHODILATOR (RESCUE) INHALERS: There is a risk from using your bronchodilator too frequently.  The risk is that over-reliance on a medication which only relaxes the muscles surrounding the breathing tubes can reduce the effectiveness of medications prescribed to reduce swelling and congestion of the tubes themselves.  Although you feel brief relief from the bronchodilator inhaler, your asthma may  actually be worsening with the tubes becoming more swollen and filled with mucus.  This can delay other crucial treatments, such as oral steroid medications. If you need to use a bronchodilator inhaler daily, several times per day, you should discuss this with your provider.  There are probably better treatments that could be used to keep your asthma under control.     HOME CARE Only take medications as instructed by your medical team. Complete the entire course of an antibiotic. Drink plenty of fluids and get plenty of rest. Avoid close contacts especially the very young and the elderly Cover your mouth if you cough or cough into your sleeve. Always remember to wash your hands A steam or ultrasonic humidifier can help congestion.   GET HELP RIGHT AWAY IF: You develop worsening fever. You become short of breath You cough up blood. Your symptoms persist after you have completed your treatment plan MAKE SURE YOU  Understand these instructions. Will watch your condition. Will get help right away if you are not doing well or get worse.    Thank you for choosing an e-visit.  Your e-visit answers were reviewed by a board certified advanced clinical practitioner to complete your personal care plan. Depending upon the condition, your plan could have included both over the counter or prescription medications.  Please review your pharmacy choice. Make sure the pharmacy is open so you can pick up prescription now. If there is a problem, you may contact your provider through [employer] and have the prescription  routed to another pharmacy.  Your safety is important to us . If you have drug allergies check your prescription carefully.   For the next 24 hours you can use MyChart to ask questions about today's visit, request a non-urgent call back, or ask for a work or school excuse. You will get an email in the next two days asking about your experience. I hope that your e-visit has been valuable  and will speed your recovery.    I have spent 5 minutes in review of e-visit questionnaire, review and updating patient chart, medical decision making and response to patient.   Delon CHRISTELLA Dickinson, PA-C

## 2023-11-30 ENCOUNTER — Encounter: Payer: Self-pay | Admitting: Family

## 2023-12-01 ENCOUNTER — Other Ambulatory Visit: Payer: Self-pay

## 2023-12-01 DIAGNOSIS — J302 Other seasonal allergic rhinitis: Secondary | ICD-10-CM

## 2023-12-01 MED ORDER — ALBUTEROL SULFATE HFA 108 (90 BASE) MCG/ACT IN AERS: 2.0000 | INHALATION_SPRAY | Freq: Four times a day (QID) | RESPIRATORY_TRACT | 0 refills | Status: AC | PRN

## 2023-12-01 MED ORDER — IPRATROPIUM BROMIDE 0.03 % NA SOLN: 2.0000 | Freq: Two times a day (BID) | NASAL | 0 refills | Status: DC

## 2023-12-23 ENCOUNTER — Other Ambulatory Visit: Payer: Self-pay | Admitting: Family

## 2023-12-23 DIAGNOSIS — J302 Other seasonal allergic rhinitis: Secondary | ICD-10-CM

## 2023-12-24 NOTE — Telephone Encounter (Signed)
 needs OV please, thx

## 2023-12-29 ENCOUNTER — Encounter: Payer: Self-pay | Admitting: Family

## 2023-12-29 ENCOUNTER — Ambulatory Visit: Admitting: Family

## 2023-12-29 ENCOUNTER — Ambulatory Visit

## 2023-12-29 VITALS — BP 137/83 | HR 74 | Temp 97.4°F | Ht 67.0 in | Wt 286.4 lb

## 2023-12-29 DIAGNOSIS — J4541 Moderate persistent asthma with (acute) exacerbation: Secondary | ICD-10-CM | POA: Diagnosis not present

## 2023-12-29 DIAGNOSIS — Z1231 Encounter for screening mammogram for malignant neoplasm of breast: Secondary | ICD-10-CM

## 2023-12-29 DIAGNOSIS — Z1211 Encounter for screening for malignant neoplasm of colon: Secondary | ICD-10-CM | POA: Diagnosis not present

## 2023-12-29 DIAGNOSIS — R059 Cough, unspecified: Secondary | ICD-10-CM | POA: Diagnosis not present

## 2023-12-29 DIAGNOSIS — I10 Essential (primary) hypertension: Secondary | ICD-10-CM

## 2023-12-29 MED ORDER — BUDESONIDE-FORMOTEROL FUMARATE 80-4.5 MCG/ACT IN AERO: 2.0000 | INHALATION_SPRAY | Freq: Two times a day (BID) | RESPIRATORY_TRACT | 3 refills | Status: AC

## 2023-12-29 MED ORDER — BREZTRI AEROSPHERE 160-9-4.8 MCG/ACT IN AERO: 2.0000 | INHALATION_SPRAY | Freq: Two times a day (BID) | RESPIRATORY_TRACT | Status: AC

## 2023-12-29 MED ORDER — LISINOPRIL-HYDROCHLOROTHIAZIDE 20-25 MG PO TABS: 1.0000 | ORAL_TABLET | Freq: Every morning | ORAL | 1 refills | Status: AC

## 2023-12-29 NOTE — Progress Notes (Signed)
 Patient ID: Tammy Briggs, female    DOB: 1970/11/09, 53 y.o.   MRN: [id number]  Chief Complaint  Patient presents with   Cough    Pt c/o Cough, SOB and wheezing. Present since August, Has tried Mullein, Astelin , Xyzal, Ibuprofen and prednisone .    Hypertension  Discussed the use of AI scribe software for clinical note transcription with the patient, who gave verbal consent to proceed.  History of Present Illness Tammy Briggs is a 53 year old female who presents for HTN f/u & with a persistent cough since August.  She experiences a persistent cough with clear mucus, worse at night but present throughout the day. The cough leads to gagging and requires frequent inhaler use. Associated symptoms include wheezing, difficulty taking a full breath, shortness of breath, and chest tightness. She has been using prednisone , an inhaler, and a nasal spray with slight improvement but persistent symptoms. She switched from Zyrtec  to Xyzal without significant improvement. A Qvar  inhaler provided some relief during a previous severe cough episode post-COVID-19 infection. [place], an over-the-counter product, initially helped with allergies but is no longer effective. She experiences puffiness as a side effect of prednisone , which she takes in a tapering dose. She has no known history of asthma. She manages hypertension with lisinopril  and hydrochlorothiazide , taken in the mornings. She adheres to her medication regimen but has not been monitoring her blood pressure at home, despite having a cuff available. She experiences no headaches or dizziness and tolerates the diuretic effect without excessive urination. Assessment & Plan Chronic cough with wheezing Chronic cough with wheezing and mucus production since August. Differential includes reactive airway disease. No asthma history but past use of Qvar  inhaler for COVID-related issues. Currently using albuterol  inhaler frequently. - Order chest x-ray to  evaluate for reactive airway disease. - Prescribe inhaler with steroid and long-acting albuterol  if insurance covers. - Provide sample of Breztri inhaler. - Consider pulmonology referral if symptoms persist or x-ray indicates need.  Allergic rhinitis Allergic rhinitis possibly contributing to chronic cough. Limited improvement with switch from Zyrtec  to Xyzal. - Continue nasal spray for allergic rhinitis. - Continue Xyzal for allergy management.  HTN Hypertension well-managed with lisinopril -hydrochlorothiazide  20-25mg . BP good today. - Refill sent for Lisinopril -HCTZ - Encourage regular home blood pressure monitoring if feeling symptomatic. - Continue to advise on lifestyle modifications including exercise, salt intake reduction, and adequate hydration. - F/U in 6 mos with labs  General maintenance - Order Colonoscopy, sending referral - Sending new referral for Mammogram  Subjective:    Outpatient Medications Prior to Visit  Medication Sig Dispense Refill   albuterol  (VENTOLIN  HFA) 108 (90 Base) MCG/ACT inhaler Inhale 2 puffs into the lungs every 6 (six) hours as needed for wheezing or shortness of breath. 8 g 0   Cholecalciferol (VITAMIN D-3) 25 MCG (1000 UT) CAPS Take 1 capsule by mouth daily at 12 noon.     ELDERBERRY PO Take 1 tablet by mouth daily at 12 noon.     ipratropium (ATROVENT ) 0.03 % nasal spray PLACE 2 SPRAYS INTO BOTH NOSTRILS EVERY 12 (TWELVE) HOURS. 30 mL 0   lisinopril -hydrochlorothiazide  (ZESTORETIC ) 20-25 MG tablet TAKE ONE (1) TABLET BY MOUTH EVERY DAY 90 tablet 1   magnesium oxide (MAG-OX) 400 (240 Mg) MG tablet Take 400 mg by mouth daily.     benzonatate  (TESSALON ) 100 MG capsule Take 1-2 capsules (100-200 mg total) by mouth 3 (three) times daily as needed. 30 capsule 0   cetirizine  (ZYRTEC ) 10  MG tablet TAKE 1 TABLET BY MOUTH EVERY DAY 90 tablet 3   loratadine  (CLARITIN ) 10 MG tablet TAKE 1 TABLET BY MOUTH EVERY DAY 30 tablet 0   predniSONE  (STERAPRED  UNI-PAK 21 TAB) 10 MG (21) TBPK tablet 6 day taper; take as directed on package instructions 21 tablet 0   No facility-administered medications prior to visit.   Past Medical History:  Diagnosis Date   Constipation    GERD (gastroesophageal reflux disease)    takes Nexium daily   History of bronchitis 3-37yrs ago   History of kidney stones    Hypertension    takes Lisinopril -HCTZ daily   Joint swelling    right    Muscle spasm    takes Flexeril  daily as needed   Pneumonia hx of-13yr ago   PONV (postoperative nausea and vomiting)    Weakness    numbness and tingling left leg   Past Surgical History:  Procedure Laterality Date   adeoinectomy     CHOLECYSTECTOMY  42yrs ago   HPV removed  age 89   LUMBAR LAMINECTOMY/DECOMPRESSION MICRODISCECTOMY Left 10/05/2013   Procedure: LUMBAR LAMINECTOMY/DECOMPRESSION MICRODISCECTOMY 1 LEVEL L5-S1;  Surgeon: Darina MALVA Boehringer, MD;  Location: [facility];  Service: Neurosurgery;  Laterality: Left;  LUMBAR LAMINECTOMY/DECOMPRESSION MICRODISCECTOMY 1 LEVEL L5-S1   MYRINGOTOMY     x 4   No Known Allergies    Objective:    Physical Exam Vitals and nursing note reviewed.  Constitutional:      Appearance: Normal appearance. She is morbidly obese.  Cardiovascular:     Rate and Rhythm: Normal rate and regular rhythm.  Pulmonary:     Effort: Pulmonary effort is normal.     Breath sounds: Examination of the right-upper field reveals wheezing. Examination of the left-upper field reveals wheezing. Examination of the right-middle field reveals wheezing. Examination of the left-middle field reveals wheezing. Examination of the right-lower field reveals wheezing. Wheezing present.  Musculoskeletal:        General: Normal range of motion.  Skin:    General: Skin is warm and dry.  Neurological:     Mental Status: She is alert.  Psychiatric:        Mood and Affect: Mood normal.        Behavior: Behavior normal.    BP 137/83 (BP Location: Left Arm,  Patient Position: Sitting, Cuff Size: Large)   Pulse 74   Temp (!) 97.4 F (36.3 C) (Temporal)   Ht 5' 7 (1.702 m)   Wt 286 lb 6 oz (129.9 kg)   SpO2 98%   BMI 44.85 kg/m  Wt Readings from Last 3 Encounters:  12/29/23 286 lb 6 oz (129.9 kg)  07/08/23 278 lb 2 oz (126.2 kg)  04/19/22 263 lb (119.3 kg)       Lucius Krabbe, NP

## 2023-12-29 NOTE — Assessment & Plan Note (Signed)
 Hypertension well-managed with lisinopril -hydrochlorothiazide  20-25mg . BP good today. - Refill sent for Lisinopril -HCTZ - Encourage regular home blood pressure monitoring if feeling symptomatic. - Continue to advise on lifestyle modifications including exercise, salt intake reduction, and adequate hydration. - F/U in 6 mos with labs

## 2024-01-01 ENCOUNTER — Ambulatory Visit: Payer: Self-pay | Admitting: Family

## 2024-01-08 ENCOUNTER — Ambulatory Visit: Admitting: Family

## 2024-01-20 ENCOUNTER — Other Ambulatory Visit: Payer: Self-pay | Admitting: Family

## 2024-01-20 DIAGNOSIS — J302 Other seasonal allergic rhinitis: Secondary | ICD-10-CM

## 2024-02-16 ENCOUNTER — Other Ambulatory Visit: Payer: Self-pay | Admitting: Family

## 2024-02-16 DIAGNOSIS — J302 Other seasonal allergic rhinitis: Secondary | ICD-10-CM

## 2024-07-02 ENCOUNTER — Ambulatory Visit: Admitting: Family
